# Patient Record
Sex: Male | Born: 1955
Health system: Southern US, Community
[De-identification: ages and names within clinical notes are randomized; demographics above are authoritative.]

## PROBLEM LIST (undated history)

## (undated) DIAGNOSIS — E7800 Pure hypercholesterolemia, unspecified: Secondary | ICD-10-CM

## (undated) DIAGNOSIS — R519 Headache, unspecified: Secondary | ICD-10-CM

## (undated) HISTORY — DX: Pure hypercholesterolemia, unspecified: E78.00

## (undated) HISTORY — PX: NO PAST SURGERIES: SHX2092

## (undated) HISTORY — DX: Headache, unspecified: R51.9

---

## 2013-07-27 ENCOUNTER — Ambulatory Visit: Payer: Self-pay | Admitting: Interventional Cardiology

## 2013-09-02 ENCOUNTER — Encounter: Payer: Self-pay | Admitting: Cardiology

## 2013-09-02 ENCOUNTER — Ambulatory Visit (INDEPENDENT_AMBULATORY_CARE_PROVIDER_SITE_OTHER): Payer: 59 | Admitting: Cardiology

## 2013-09-02 VITALS — BP 122/84 | HR 46 | Ht 69.0 in | Wt 202.0 lb

## 2013-09-02 DIAGNOSIS — Z8249 Family history of ischemic heart disease and other diseases of the circulatory system: Secondary | ICD-10-CM

## 2013-09-02 DIAGNOSIS — E7800 Pure hypercholesterolemia, unspecified: Secondary | ICD-10-CM

## 2013-09-02 MED ORDER — ATORVASTATIN CALCIUM 20 MG PO TABS: 20.0000 mg | ORAL_TABLET | Freq: Every day | ORAL | Status: DC

## 2013-09-02 MED ORDER — SIMVASTATIN 20 MG PO TABS: 20.0000 mg | ORAL_TABLET | Freq: Every day | ORAL | Status: DC

## 2013-09-02 NOTE — Progress Notes (Signed)
      [address] [place], [place]  [address] Phone: [phone] Fax:  [phone]  Date:  09/02/2013   ID:  Oscar LeaderJody Bridwell, DOB 02/21/1956, MRN [id number]  PCP:  No primary provider on file.   History of Present Illness: Oscar Woodard is a 58 y.o. male here for the evaluation of possible coronary artery disease with a family history of CAD. His father had myocardial infarction at 5756, brother had MI at 1956. LDL on simvastatin is 92. He denies any chest pain, shortness of breath.  Early in his 3420s, he battled with Raynaud's phenomenon quite frequently. This year during her intense winter, he did have an episode once again of blue fingertips, pain. This is unusual for him.  Prone to dehydration he states. Exercises 3-5 times a week on treadmill at home. Travels occasionally with his business.   Wt Readings from Last 3 Encounters:  09/02/13 202 lb (91.627 kg)     No past medical history on file. prior Raynaud's phenomenon  No past surgical history on file.  Current Outpatient Prescriptions  Medication Sig Dispense Refill  . aspirin 81 MG tablet Take 81 mg by mouth daily.       No current facility-administered medications for this visit.    Allergies:    Allergies  Allergen Reactions  . Penicillins     Upset stomach    Social History:  The patient  reports that he has never smoked. He does not have any smokeless tobacco history on file. 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  Family History  Problem Relation Age of Onset  . Heart attack Father 8556  . Heart attack Brother 2656   father and brother MI at 4256.  ROS:  Please see the history of present illness.   No syncope, no bleeding, no orthopnea, no PND, no rashes, no bleeding, no strokelike symptoms, no chest pain, shortness of breath.   All other systems reviewed and negative.   PHYSICAL EXAM: VS:  BP 122/84  Pulse 46  Ht 5\' 9"  (1.753 m)  Wt 202 lb (91.627 kg)  BMI 29.82 kg/m2 Well nourished, well developed, in no  acute distress HEENT: normal, [place]/AT, EOMI Neck: no JVD, normal carotid upstroke, no bruit Cardiac:  normal S1, S2; RRR; no murmur Lungs:  clear to auscultation bilaterally, no wheezing, rhonchi or rales Abd: soft, nontender, no hepatomegaly, no bruits Ext: no edema, 2+ distal pulses Skin: warm and dry GU: deferred Neuro: no focal abnormalities noted, AAO x 3  EKG:  09/02/13-heart rate 46, sinus bradycardia, normal intervals.    Labs: 07/04/13, glucose 103, creatinine 0.95, liver functions normal, LDL 92, triglycerides 96, HDL 52  ASSESSMENT AND PLAN:  1. Strong family history of CAD/myocardial infarction-remote aggressive primary prevention. I agree with aspirin. I agree with statin therapy. His last LDL was in the 90s. I will transition him back to atorvastatin 20 mg once a day. I will also check an exercise treadmill test in him. His brother was asymptomatic prior to his MI. His brother had "widow maker lesion ". A treadmill will be helpful to exclude any degree of significant ischemia. I will see him back in one year. Continue with exercise. 2. Hyperlipidemia-Dr. Docia ChuckKoirala we'll continue to follow. I will change him from simvastatin over to atorvastatin 20 mg. I would like for his LDL to be 70.  Signed, Donato SchultzMark Lasasha Brophy, MD [facility]  09/02/2013 12:22 PM

## 2013-09-02 NOTE — Patient Instructions (Signed)
Your physician has recommended you make the following change in your medication:   1. Stop Simvastatin  2. Start Atorvastatin 20mg  once daily  Your physician has requested that you have an exercise tolerance test. For further information please visit [email or web address] Please also follow instruction sheet, as given.  Your physician wants you to follow-up in: 1 year with Dr. Anne FuSkains. You will receive a reminder letter in the mail two months in advance. If you don't receive a letter, please call our office to schedule the follow-up appointment.

## 2013-10-04 ENCOUNTER — Encounter: Payer: 59 | Admitting: Nurse Practitioner

## 2013-11-11 ENCOUNTER — Ambulatory Visit (INDEPENDENT_AMBULATORY_CARE_PROVIDER_SITE_OTHER): Payer: 59 | Admitting: Nurse Practitioner

## 2013-11-11 VITALS — BP 115/77 | HR 73

## 2013-11-11 DIAGNOSIS — E785 Hyperlipidemia, unspecified: Secondary | ICD-10-CM

## 2013-11-11 DIAGNOSIS — E7800 Pure hypercholesterolemia, unspecified: Secondary | ICD-10-CM | POA: Insufficient documentation

## 2013-11-11 DIAGNOSIS — Z8249 Family history of ischemic heart disease and other diseases of the circulatory system: Secondary | ICD-10-CM

## 2013-11-11 NOTE — Progress Notes (Addendum)
Exercise Treadmill Test  Pre-Exercise Testing Evaluation Rhythm: sinus bradycardia  Rate: 58 bpm     Test  Exercise Tolerance Test Ordering MD: Donato SchultzMark Skains, MD  Interpreting MD: Norma FredricksonLori Gerhardt, NP  Unique Test No: 1  Treadmill:  1  Indication for ETT: Family history  Contraindication to ETT: No   Stress Modality: exercise - treadmill  Cardiac Imaging Performed: non   Protocol: standard Bruce - maximal  Max BP:  189/70  Max MPHR (bpm):  165 85% MPR (bpm):  139  MPHR obtained (bpm):  142 % MPHR obtained:  86%  Reached 85% MPHR (min:sec):  9:18 Total Exercise Time (min-sec):  10:00  Workload in METS:  11.7 Borg Scale: 17  Reason ETT Terminated:  desired heart rate attained    ST Segment Analysis At Rest: normal ST segments - no evidence of significant ST depression With Exercise: no evidence of significant ST depression  Other Information Arrhythmia:  No Angina during ETT:  absent (0) Quality of ETT:  diagnostic  ETT Interpretation:  normal - no evidence of ischemia by ST analysis  Comments: Patient presents today for routine GXT. Has HLD and strong family history of CAD. No active symptoms.   Today the patient exercised on the standard Bruce protocol for a total of 10 minutes.  Good exercise tolerance.  Adequate blood pressure response.  Clinically negative for chest pain. Test was stopped due to achievement of target HR - no angina.  EKG with nonspecific changes/T wave inversion inferiorly -  No diagnostic ST changes to meet criteria. No significant arrhythmia noted.    Recommendations:  Reviewed with Dr. Swazil[place]Jordan - no significant ST changes. Was able to go 10 minutes on the standard Bruce protocol. He feels no further testing is indicated but would like for Dr. Anne FuSkains to review when he is back on Monday.   CV risk factor modification  See back as directed unless Dr. Anne FuSkains would like further testing.  Patient is agreeable to this plan and will call if any problems develop  in the interim.   Rosalio MacadamiaLori C. Gerhardt, RN, ANP-C [facility] [address] [place], [place]  [address] [phone]   Addendum: 11/14/13  GXT reviewed with Dr. Anne FuSkains as well today. He agreed that no further testing is needed. Patient will be advised.

## 2013-11-14 ENCOUNTER — Telehealth: Payer: Self-pay | Admitting: Cardiology

## 2013-11-14 ENCOUNTER — Telehealth: Payer: Self-pay | Admitting: *Deleted

## 2013-11-14 NOTE — Telephone Encounter (Signed)
Follow Up ° °Pt returned call//  °

## 2013-11-14 NOTE — Telephone Encounter (Signed)
Left message on machine for pt to contact the office.   

## 2013-11-22 ENCOUNTER — Telehealth: Payer: Self-pay | Admitting: *Deleted

## 2013-11-22 ENCOUNTER — Encounter: Payer: Self-pay | Admitting: *Deleted

## 2013-11-22 NOTE — Telephone Encounter (Signed)
S/w pt's wife is aware of treadmill results and that no further testing is needed at this time

## 2013-11-22 NOTE — Telephone Encounter (Signed)
Message copied by Debbe BalesGONZALEZ, Kaitlinn Iversen R on Tue Nov 22, 2013  4:57 PM ------      Message from: Rosalio MacadamiaGERHARDT, LORI C      Created: Mon Nov 14, 2013  8:33 AM       Can you call him and let him know that Dr. Anne FuSkains reviewed his treadmill also and says that it looks ok and he does not feel any further testing is needed.            lori ------

## 2013-11-22 NOTE — Telephone Encounter (Signed)
New message  ° ° °Patient returning nurse call.   °

## 2014-09-10 ENCOUNTER — Other Ambulatory Visit: Payer: Self-pay | Admitting: Cardiology

## 2014-10-04 ENCOUNTER — Encounter: Payer: Self-pay | Admitting: Cardiology

## 2014-10-04 ENCOUNTER — Other Ambulatory Visit: Payer: Self-pay | Admitting: *Deleted

## 2014-10-04 ENCOUNTER — Ambulatory Visit (INDEPENDENT_AMBULATORY_CARE_PROVIDER_SITE_OTHER): Payer: 59 | Admitting: Cardiology

## 2014-10-04 VITALS — BP 112/72 | HR 57 | Ht 69.0 in | Wt 203.0 lb

## 2014-10-04 DIAGNOSIS — E785 Hyperlipidemia, unspecified: Secondary | ICD-10-CM

## 2014-10-04 DIAGNOSIS — Z8249 Family history of ischemic heart disease and other diseases of the circulatory system: Secondary | ICD-10-CM

## 2014-10-04 NOTE — Progress Notes (Signed)
      [address] [place], [place]  [address] Phone: [phone] Fax:  [phone]  Date:  10/04/2014   ID:  Oscar LeaderJody Eddie, DOB 02/12/1956, MRN [id number]  PCP:  Darrow BussingKOIRALA,DIBAS, MD   History of Present Illness: Oscar Woodard is a 59 y.o. male here for the evaluation of possible coronary artery disease with a family history of CAD. His father had myocardial infarction at 1056, brother had MI at 7356. LDL on simvastatin is 92, now on atorvastatin. He denies any chest pain, shortness of breath.  Early in his 2220s, he battled with Raynaud's phenomenon quite frequently. This year during her intense winter, he did have an episode once again of blue fingertips, pain. This is unusual for him.  Prone to dehydration he states. Exercises 3-5 times a week on treadmill at home. 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   Wt Readings from Last 3 Encounters:  10/04/14 203 lb (92.08 kg)  09/02/13 202 lb (91.627 kg)     No past medical history on file. prior Raynaud's phenomenon  No past surgical history on file.  Current Outpatient Prescriptions  Medication Sig Dispense Refill  . aspirin 81 MG tablet Take 81 mg by mouth daily.    Marland Kitchen. atorvastatin (LIPITOR) 20 MG tablet TAKE 1 TABLET DAILY 90 tablet 0   No current facility-administered medications for this visit.    Allergies:    Allergies  Allergen Reactions  . Penicillins     Upset stomach    Social History:  The patient  reports that he has never smoked. He does not have any smokeless tobacco history on file. 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  Family History  Problem Relation Age of Onset  . Heart attack Father 2856  . Heart attack Brother 3756   father and brother MI at 4856.   ROS:  Please see the history of present illness.   No syncope, no bleeding, no orthopnea, no PND, no rashes, no bleeding, no strokelike symptoms, no chest pain, shortness of breath.   All other systems reviewed and negative.   PHYSICAL EXAM: VS:  BP  112/72 mmHg  Pulse 57  Ht 5\' 9"  (1.753 m)  Wt 203 lb (92.08 kg)  BMI 29.96 kg/m2 Well nourished, well developed, in no acute distress HEENT: normal, [place]/AT, EOMI Neck: no JVD, normal carotid upstroke, no bruit Cardiac:  normal S1, S2; RRR; no murmur Lungs:  clear to auscultation bilaterally, no wheezing, rhonchi or rales Abd: soft, nontender, no hepatomegaly, no bruits Ext: no edema, 2+ distal pulses Skin: warm and dry GU: deferred Neuro: no focal abnormalities noted, AAO x 3  EKG:  09/02/13-heart rate 46, sinus bradycardia, normal intervals.    Labs: 07/04/13, glucose 103, creatinine 0.95, liver functions normal, LDL 92, triglycerides 96, HDL 52  ASSESSMENT AND PLAN:  1. Strong family history of CAD/myocardial infarction-remote aggressive primary prevention. I agree with aspirin. I agree with statin therapy. His last LDL was in the 90s. I will transition him back to atorvastatin 20 mg once a day. Exercise treadmill test in 2015 was reassuring. His brother was asymptomatic prior to his MI. His brother had "widow maker lesion ".I will see him back in one year. Continue with exercise. 2. Hyperlipidemia-Dr. Docia ChuckKoirala we'll continue to follow.atorvastatin 20 mg. I would like for his LDL to be 70.  Signed, Donato SchultzMark Winslow Ederer, MD [facility]  10/04/2014 9:54 AM

## 2014-10-04 NOTE — Patient Instructions (Signed)
Medication Instructions:  Your physician recommends that you continue on your current medications as directed. Please refer to the Current Medication list given to you today.  Follow-Up: Follow up in 1 year with Dr. Skains.  You will receive a letter in the mail 2 months before you are due.  Please call us when you receive this letter to schedule your follow up appointment.  Thank you for choosing [facility]!!       

## 2014-10-25 ENCOUNTER — Other Ambulatory Visit: Payer: Self-pay

## 2014-10-25 MED ORDER — ATORVASTATIN CALCIUM 20 MG PO TABS: 20.0000 mg | ORAL_TABLET | Freq: Every day | ORAL | Status: DC

## 2014-10-25 NOTE — Telephone Encounter (Signed)
Per note 5.18.16 

## 2014-11-29 ENCOUNTER — Other Ambulatory Visit: Payer: Self-pay | Admitting: Cardiology

## 2015-10-02 ENCOUNTER — Telehealth: Payer: Self-pay | Admitting: Cardiology

## 2015-10-02 NOTE — Telephone Encounter (Signed)
Called pt and left message for pt to call back to update pt's Fm & medical Hx.

## 2015-10-07 NOTE — Progress Notes (Signed)
[address] [place], [place]  [address] Phone: [phone] Fax:  [phone]  Date:  10/08/2015   ID:  Oscar LeaderJody Woodard, DOB 02/15/1956, MRN [id number]  PCP:  Darrow BussingKOIRALA,DIBAS, MD   History of Present Illness: Oscar LeaderJody Demonbreun is a 60 y.o. male here for the evaluation of possible coronary artery disease with a family history of CAD. His father had myocardial infarction at 6156, brother had MI at 3856. LDL on atorvastatin is 84. He denies any chest pain, shortness of breath. 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 He mentioned a recent trip to [place], enjoys crab cakes.  Early in his 1420s, he battled with Raynaud's phenomenon quite frequently. This year during her intense winter, he did have an episode once again of blue fingertips, pain. This is unusual for him.  Prone to dehydration he states. Exercises 3-5 times a week on treadmill at home. Travels occasionally with his business, [employer].  Overall quite pleased the way he is feeling. Weight is slightly increased. Working on this.   Wt Readings from Last 3 Encounters:  10/08/15 205 lb 12.8 oz (93.35 kg)  10/04/14 203 lb (92.08 kg)  09/02/13 202 lb (91.627 kg)     No past medical history on file. prior Raynaud's phenomenon  No past surgical history on file.  Current Outpatient Prescriptions  Medication Sig Dispense Refill  . aspirin 81 MG tablet Take 81 mg by mouth daily.    Marland Kitchen. atorvastatin (LIPITOR) 20 MG tablet Take 1 tablet (20 mg total) by mouth daily. 90 tablet 3   No current facility-administered medications for this visit.    Allergies:    Allergies  Allergen Reactions  . Penicillins     Upset stomach    Social History:  The patient  reports that he has never smoked. He does not have any smokeless tobacco history on file. 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  Family History  Problem Relation Age of Onset  . Heart attack Father 3856  . Heart attack Brother 4356   father and brother MI at 7256.    ROS:  Please see the history of present illness.   No syncope, no bleeding, no orthopnea, no PND, no rashes, no bleeding, no strokelike symptoms, no chest pain, shortness of breath.   All other systems reviewed and negative.   PHYSICAL EXAM: VS:  BP 126/78 mmHg  Pulse 53  Ht 5\' 9"  (1.753 m)  Wt 205 lb 12.8 oz (93.35 kg)  BMI 30.38 kg/m2 Well nourished, well developed, in no acute distress HEENT: normal, [place]/AT, EOMI Neck: no JVD, normal carotid upstroke, no bruit Cardiac:  normal S1, S2; bradycardic regular; no murmur Lungs:  clear to auscultation bilaterally, no wheezing, rhonchi or rales Abd: soft, nontender, no hepatomegaly, no bruits Ext: no edema, 2+ distal pulses Skin: warm and dry GU: deferred Neuro: no focal abnormalities noted, AAO x 3  EKG:  EKG was ordered today. 10/08/15-sinus bradycardia rate 53 with no other significant abnormalities personally viewed-prior 09/02/13-heart rate 46, sinus bradycardia, normal intervals.     Exercise treadmill test 2015-low risk, no ischemia  Labs: 07/04/13, glucose 103, creatinine 0.95, liver functions normal, LDL 92, triglycerides 96, HDL 52  ASSESSMENT AND PLAN:  1. Strong family history of CAD/myocardial infarction-remote aggressive primary prevention. I agree with aspirin. I agree with statin therapy. His last LDL was in the 90s. I will transition him back to atorvastatin 20 mg once a day. Exercise treadmill test in  2015 was reassuring. His brother was asymptomatic prior to his MI. His brother had "widow maker lesion ".I will see him back in one year. Continue with exercise. Continue to work on weight loss. BMI is 30.3. Obese range. I would like for him to lose at least 10 pounds. 2. Hyperlipidemia-Dr. Docia Chuck we'll continue to follow.atorvastatin 20 mg. LDL 84 in 2017.  Signed, Donato Schultz, MD [facility]  10/08/2015 8:48 AM

## 2015-10-08 ENCOUNTER — Encounter: Payer: Self-pay | Admitting: Cardiology

## 2015-10-08 ENCOUNTER — Ambulatory Visit (INDEPENDENT_AMBULATORY_CARE_PROVIDER_SITE_OTHER): Payer: 59 | Admitting: Cardiology

## 2015-10-08 VITALS — BP 126/78 | HR 53 | Ht 69.0 in | Wt 205.8 lb

## 2015-10-08 DIAGNOSIS — E785 Hyperlipidemia, unspecified: Secondary | ICD-10-CM | POA: Diagnosis not present

## 2015-10-08 DIAGNOSIS — Z8249 Family history of ischemic heart disease and other diseases of the circulatory system: Secondary | ICD-10-CM

## 2015-10-08 NOTE — Patient Instructions (Signed)

## 2016-02-18 ENCOUNTER — Other Ambulatory Visit: Payer: Self-pay | Admitting: Cardiology

## 2016-06-19 DIAGNOSIS — M2021 Hallux rigidus, right foot: Secondary | ICD-10-CM | POA: Diagnosis not present

## 2016-07-16 DIAGNOSIS — M79671 Pain in right foot: Secondary | ICD-10-CM | POA: Diagnosis not present

## 2016-07-16 DIAGNOSIS — M2021 Hallux rigidus, right foot: Secondary | ICD-10-CM | POA: Diagnosis not present

## 2016-07-28 DIAGNOSIS — M2021 Hallux rigidus, right foot: Secondary | ICD-10-CM | POA: Diagnosis not present

## 2016-08-04 DIAGNOSIS — M2021 Hallux rigidus, right foot: Secondary | ICD-10-CM | POA: Diagnosis not present

## 2016-08-08 DIAGNOSIS — Z1159 Encounter for screening for other viral diseases: Secondary | ICD-10-CM | POA: Diagnosis not present

## 2016-08-08 DIAGNOSIS — Z0000 Encounter for general adult medical examination without abnormal findings: Secondary | ICD-10-CM | POA: Diagnosis not present

## 2016-08-08 DIAGNOSIS — Z131 Encounter for screening for diabetes mellitus: Secondary | ICD-10-CM | POA: Diagnosis not present

## 2016-08-08 DIAGNOSIS — E7800 Pure hypercholesterolemia, unspecified: Secondary | ICD-10-CM | POA: Diagnosis not present

## 2016-08-08 DIAGNOSIS — Z125 Encounter for screening for malignant neoplasm of prostate: Secondary | ICD-10-CM | POA: Diagnosis not present

## 2016-10-16 ENCOUNTER — Ambulatory Visit (INDEPENDENT_AMBULATORY_CARE_PROVIDER_SITE_OTHER): Payer: 59 | Admitting: Cardiology

## 2016-10-16 ENCOUNTER — Encounter (INDEPENDENT_AMBULATORY_CARE_PROVIDER_SITE_OTHER): Payer: Self-pay

## 2016-10-16 ENCOUNTER — Encounter: Payer: Self-pay | Admitting: Cardiology

## 2016-10-16 VITALS — BP 112/78 | HR 54 | Ht 69.0 in | Wt 204.2 lb

## 2016-10-16 DIAGNOSIS — E669 Obesity, unspecified: Secondary | ICD-10-CM

## 2016-10-16 DIAGNOSIS — Z8249 Family history of ischemic heart disease and other diseases of the circulatory system: Secondary | ICD-10-CM

## 2016-10-16 DIAGNOSIS — E7800 Pure hypercholesterolemia, unspecified: Secondary | ICD-10-CM

## 2016-10-16 NOTE — Progress Notes (Signed)
[address] [place], [place]  [address] Phone: [phone] Fax:  [phone]  Date:  10/16/2016   ID:  Oscar Woodard, DOB 11/18/1955, MRN [id number]  PCP:  Darrow BussingKoirala, Dibas, MD   History of Present Illness: Oscar LeaderJody Woodard is a 61 y.o. male here for the evaluation of possible coronary artery disease with a family history of CAD. His father had myocardial infarction at 3556, brother had MI at 6956. LDL on atorvastatin is 84. He denies any chest pain, shortness of breath. 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 He mentioned a recent trip to [place], enjoys crab cakes.  Early in his 320s, he battled with Raynaud's phenomenon quite frequently. This year during her intense winter, he did have an episode once again of blue fingertips, pain. This is unusual for him.  Prone to dehydration he states. Exercises 3-5 times a week on treadmill at home. Travels occasionally with his business, [employer].  10/16/16-no changes over the past year. Doing well. Sprained his toe in November has not been able to lose any significant weight. Still traveling quite a bit. Denies chest pain, syncope, bleeding, orthopnea, PND. He did recently have a coworker that had a heart attack. He is now doing well.  Wt Readings from Last 3 Encounters:  10/16/16 204 lb 3.2 oz (92.6 kg)  10/08/15 205 lb 12.8 oz (93.4 kg)  10/04/14 203 lb (92.1 kg)     No past medical history on file. prior Raynaud's phenomenon  No past surgical history on file.  Current Outpatient Prescriptions  Medication Sig Dispense Refill  . aspirin 81 MG tablet Take 81 mg by mouth daily.    Marland Kitchen. atorvastatin (LIPITOR) 20 MG tablet TAKE 1 TABLET DAILY 90 tablet 2   No current facility-administered medications for this visit.     Allergies:    Allergies  Allergen Reactions  . Penicillins     Upset stomach    Social History:  The patient  reports that he has never smoked. He has never used smokeless tobacco. 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  Family History  Problem Relation Age of Onset  . Heart attack Father 156  . Heart attack Brother 3156   father and brother MI at 7156.   ROS:  Please see the history of present illness.   No syncope, no bleeding, no orthopnea, no PND, no rashes, no bleeding, no strokelike symptoms, no chest pain, shortness of breath.   All other systems reviewed and negative.   PHYSICAL EXAM: VS:  BP 112/78   Pulse (!) 54   Ht 5\' 9"  (1.753 m)   Wt 204 lb 3.2 oz (92.6 kg)   BMI 30.16 kg/m  GEN: Well nourished, well developed, in no acute distress  HEENT: normal  Neck: no JVD, carotid bruits, or masses Cardiac: RRR; no murmurs, rubs, or gallops,no edema  Respiratory:  clear to auscultation bilaterally, normal work of breathing GI: soft, nontender, nondistended, + BS MS: no deformity or atrophy  Skin: warm and dry, no rash Neuro:  Alert and Oriented x 3, Strength and sensation are intact Psych: euthymic mood, full affect  EKG:  EKG was ordered today. 10/16/16-sinus rhythm/sinus bradycardia rate 54 with no other significant abnormalities 10/08/15-sinus bradycardia rate 53 with no other significant abnormalities personally viewed-prior 09/02/13-heart rate 46, sinus bradycardia, normal intervals.     Exercise treadmill test 2015-low risk, no ischemia  Labs: 07/04/13, glucose 103, creatinine 0.95, liver functions normal, LDL 92,  triglycerides 96, HDL 52  ASSESSMENT AND PLAN:   1. Strong family history of CAD/myocardial infarction-remote aggressive primary prevention. I agree with aspirin. I agree with statin therapy. His last LDL was in the 90s. I will transition him back to atorvastatin 20 mg once a day. Exercise treadmill test in 2015 was reassuring. His brother was asymptomatic prior to his MI. His brother had "widow maker lesion ".I will see him back in one year. Continue with exercise. Continue to work on weight loss. BMI is 30.1. Obese range. I would like for him to lose at least 10  pounds. We discussed again today. No changes in plan. EKG reassuring. Prior treadmill test in 2015 reassuring. 2. Hyperlipidemia-Dr. Docia Chuck we'll continue to follow.atorvastatin 20 mg. LDL 64 previously. Described to him the benefits of plaque stabilization.  Signed, Donato Schultz, MD [facility]  10/16/2016 9:51 AM

## 2016-10-16 NOTE — Patient Instructions (Signed)

## 2016-10-17 NOTE — Addendum Note (Signed)
Addended by: Briscoe DeutscherWASHINGTON, Kellan Raffield R on: 10/17/2016 01:43 PM   Modules accepted: Orders

## 2017-02-02 ENCOUNTER — Other Ambulatory Visit: Payer: Self-pay | Admitting: Cardiology

## 2017-02-07 DIAGNOSIS — Z23 Encounter for immunization: Secondary | ICD-10-CM | POA: Diagnosis not present

## 2017-09-15 ENCOUNTER — Encounter: Payer: Self-pay | Admitting: Cardiology

## 2017-10-21 ENCOUNTER — Ambulatory Visit: Payer: 59 | Admitting: Cardiology

## 2017-10-21 ENCOUNTER — Other Ambulatory Visit: Payer: Self-pay | Admitting: Cardiology

## 2017-11-06 ENCOUNTER — Ambulatory Visit (INDEPENDENT_AMBULATORY_CARE_PROVIDER_SITE_OTHER): Payer: 59 | Admitting: Cardiology

## 2017-11-06 ENCOUNTER — Encounter (INDEPENDENT_AMBULATORY_CARE_PROVIDER_SITE_OTHER): Payer: Self-pay

## 2017-11-06 ENCOUNTER — Encounter: Payer: Self-pay | Admitting: Cardiology

## 2017-11-06 VITALS — BP 106/82 | HR 60 | Ht 69.0 in | Wt 206.8 lb

## 2017-11-06 DIAGNOSIS — Z8249 Family history of ischemic heart disease and other diseases of the circulatory system: Secondary | ICD-10-CM

## 2017-11-06 DIAGNOSIS — E7800 Pure hypercholesterolemia, unspecified: Secondary | ICD-10-CM | POA: Diagnosis not present

## 2017-11-06 MED ORDER — ATORVASTATIN CALCIUM 20 MG PO TABS: 20.0000 mg | ORAL_TABLET | Freq: Every day | ORAL | 3 refills | Status: DC

## 2017-11-06 NOTE — Patient Instructions (Signed)

## 2017-11-06 NOTE — Progress Notes (Signed)
[address] [place], [place]  [address] Phone: [phone](336) [phone] Fax:  [phone]([phone]  Date:  11/06/2017   ID:  Oscar LeaderJody Odonovan, DOB 05/11/1956, MRN [id number]  PCP:  Darrow BussingKoirala, Dib[place], MD   History of Present Illness: Oscar Woodard is a 62 y.o. male here for the evaluation of possible coronary artery disease with a family history of CAD. His father had myocardial infarction at 7456, brother had MI at 2856. LDL on atorvastatin is 84. He denies any chest pain, shortness of breath. 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 He mentioned a recent trip to [place], enjoys crab cakes.  Early in his 1320s, he battled with Raynaud's phenomenon quite frequently. This year during her intense winter, he did have an episode once again of blue fingertips, pain. This is unusual for him.  Prone to dehydration he states. Exercises 3-5 times a week on treadmill at home. Travels occasionally with his business, [employer].  10/16/16-no changes over the past year. Doing well. Sprained his toe in November has not been able to lose any significant weight. Still traveling quite a bit. Denies chest pain, syncope, bleeding, orthopnea, PND. He did recently have a coworker that had a heart attack. He is now doing well.  11/06/2017-overall doing quite well, no chest pain fevers chills nausea vomiting syncope bleeding.  Had discussion today about prevention.  See below.    Wt Readings from Last 3 Encounters:  11/06/17 206 lb 12.8 oz (93.8 kg)  10/16/16 204 lb 3.2 oz (92.6 kg)  10/08/15 205 lb 12.8 oz (93.4 kg)     History reviewed. No pertinent past medical history. prior Raynaud's phenomenon  History reviewed. No pertinent surgical history.  Current Outpatient Medications  Medication Sig Dispense Refill  . aspirin 81 MG tablet Take 81 mg by mouth daily.    Marland Kitchen. atorvastatin (LIPITOR) 20 MG tablet Take 1 tablet (20 mg total) by mouth daily. 90 tablet 3   No current facility-administered  medications for this visit.     Allergies:    Allergies  Allergen Reactions  . Penicillins     Upset stomach    Social History:  The patient  reports that he has never smoked. He has never used smokeless tobacco. 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  Family History  Problem Relation Age of Onset  . Heart attack Father 6956  . Heart attack Brother 4156   father and brother MI at 7156.   ROS:  Please see the history of present illness.   No syncope, no bleeding, no orthopnea, no PND, no rashes, no bleeding, no strokelike symptoms, no chest pain, shortness of breath.   All other systems reviewed and negative.   PHYSICAL EXAM: VS:  BP 106/82   Pulse 60   Ht 5\' 9"  (1.753 m)   Wt 206 lb 12.8 oz (93.8 kg)   SpO2 96%   BMI 30.54 kg/m  GEN: Well nourished, well developed, in no acute distress  HEENT: normal  Neck: no JVD, carotid bruits, or masses Cardiac: RRR; no murmurs, rubs, or gallops,no edema  Respiratory:  clear to auscultation bilaterally, normal work of breathing GI: soft, nontender, nondistended, + BS MS: no deformity or atrophy  Skin: warm and dry, no rash Neuro:  Alert and Oriented x 3, Strength and sensation are intact Psych: euthymic mood, full affect   EKG:  EKG was ordered today.  11/06/2017-sinus rhythm 60 no other abnormalities personally viewed-prior 10/16/16-sinus rhythm/sinus bradycardia rate 54  with no other significant abnormalities 10/08/15-sinus bradycardia rate 53 with no other significant abnormalities personally viewed-prior 09/02/13-heart rate 46, sinus bradycardia, normal intervals.     Exercise treadmill test 2015-low risk, no ischemia  Labs: 07/04/13, glucose 103, creatinine 0.95, liver functions normal, LDL 92, triglycerides 96, HDL 52  ASSESSMENT AND PLAN:   1. Strong family history of CAD/myocardial infarction-remote aggressive primary prevention. I agree with aspirin, prevention risk outweigh bleeding risk. I agree with statin therapy. His last LDL was in the  80s. Back to atorvastatin 20 mg once a day. Exercise treadmill test in 2015 was reassuring. His brother was asymptomatic prior to his MI. His brother had "widow maker lesion ". I will see him back in one year. Continue with exercise. Continue to work on weight loss.   He is having no symptoms, no chest pain, no shortness of breath.  Taking his medications without any side effects.  We discussed the potential goals for screening tests like CT scan today.  We will continue with aggressive medical management at this point. 2. Hyperlipidemia-Dr. Docia Chuck we'll continue to follow.atorvastatin 20 mg. LDL 64 previously. Described to him the benefits of plaque stabilization.  It is made.  Signed, Donato Schultz, MD [facility]  11/06/2017 8:54 AM

## 2017-12-11 DIAGNOSIS — E7800 Pure hypercholesterolemia, unspecified: Secondary | ICD-10-CM | POA: Diagnosis not present

## 2017-12-11 DIAGNOSIS — Z0000 Encounter for general adult medical examination without abnormal findings: Secondary | ICD-10-CM | POA: Diagnosis not present

## 2018-02-10 DIAGNOSIS — Z23 Encounter for immunization: Secondary | ICD-10-CM | POA: Diagnosis not present

## 2018-07-16 DIAGNOSIS — J069 Acute upper respiratory infection, unspecified: Secondary | ICD-10-CM | POA: Diagnosis not present

## 2018-07-16 DIAGNOSIS — H9202 Otalgia, left ear: Secondary | ICD-10-CM | POA: Diagnosis not present

## 2018-07-23 DIAGNOSIS — H9012 Conductive hearing loss, unilateral, left ear, with unrestricted hearing on the contralateral side: Secondary | ICD-10-CM | POA: Diagnosis not present

## 2018-07-23 DIAGNOSIS — H6122 Impacted cerumen, left ear: Secondary | ICD-10-CM | POA: Diagnosis not present

## 2018-07-23 DIAGNOSIS — H6502 Acute serous otitis media, left ear: Secondary | ICD-10-CM | POA: Diagnosis not present

## 2018-09-27 DIAGNOSIS — H93292 Other abnormal auditory perceptions, left ear: Secondary | ICD-10-CM | POA: Diagnosis not present

## 2018-09-27 DIAGNOSIS — H9202 Otalgia, left ear: Secondary | ICD-10-CM | POA: Diagnosis not present

## 2018-09-27 DIAGNOSIS — H9312 Tinnitus, left ear: Secondary | ICD-10-CM | POA: Diagnosis not present

## 2018-09-27 DIAGNOSIS — G510 Bell's palsy: Secondary | ICD-10-CM | POA: Diagnosis not present

## 2018-09-29 ENCOUNTER — Other Ambulatory Visit: Payer: Self-pay | Admitting: Otolaryngology

## 2018-09-29 DIAGNOSIS — G510 Bell's palsy: Secondary | ICD-10-CM

## 2018-10-04 ENCOUNTER — Other Ambulatory Visit: Payer: Self-pay

## 2018-10-04 ENCOUNTER — Ambulatory Visit
Admission: RE | Admit: 2018-10-04 | Discharge: 2018-10-04 | Disposition: A | Discharge status: Home / Self Care | Payer: 59 | Source: Ambulatory Visit | Attending: Otolaryngology | Admitting: Otolaryngology

## 2018-10-04 DIAGNOSIS — G510 Bell's palsy: Secondary | ICD-10-CM

## 2018-10-04 MED ORDER — GADOBENATE DIMEGLUMINE 529 MG/ML IV SOLN
19.0000 mL | Freq: Once | INTRAVENOUS | Status: AC | PRN
  Administered 2018-10-04: 10:00:00 19 mL via INTRAVENOUS

## 2018-10-09 ENCOUNTER — Other Ambulatory Visit: Payer: 59

## 2018-10-12 ENCOUNTER — Telehealth: Payer: Self-pay | Admitting: Neurology

## 2018-10-12 NOTE — Telephone Encounter (Signed)
Due to current COVID 19 pandemic, our office is severely reducing in office visits until further notice, in order to minimize the risk to our patients and healthcare providers.    Called patient and scheduled a virtual visit with Dr. Terrace Arabia for 5/28. Patient verbalized understanding of the [email or web address] process and I have sent him an e-mail (to [email or web address]) with link and instructions as well as my name and office number/hours for reference. Patient understands that he will receive a call from RN to update chart.   Pt understands that although there may be some limitations with this type of visit, we will take all precautions to reduce any security or privacy concerns.  Pt understands that this will be treated like an in office visit and we will file with pt's insurance, and there may be a patient responsible charge related to this service.an in office visit and we will file with pt's insurance, and there may be a patient responsible charge related to this service.

## 2018-10-13 ENCOUNTER — Encounter: Payer: Self-pay | Admitting: Neurology

## 2018-10-13 ENCOUNTER — Other Ambulatory Visit: Payer: 59

## 2018-10-13 NOTE — Telephone Encounter (Signed)
I contacted the pt and left a vm so we could complete the pre charting for 10/14/18 virtual visit.  Patient was not available so I left a vm.  Patient was advised of [facility] hours and #.  Chart updated from referral paper work received.

## 2018-10-13 NOTE — Addendum Note (Signed)
Addended by: Ann Maki T on: 10/13/2018 04:28 PM   Modules accepted: Orders

## 2018-10-14 ENCOUNTER — Encounter: Payer: Self-pay | Admitting: Neurology

## 2018-10-14 ENCOUNTER — Telehealth: Payer: Self-pay | Admitting: Neurology

## 2018-10-14 ENCOUNTER — Other Ambulatory Visit: Payer: Self-pay

## 2018-10-14 ENCOUNTER — Ambulatory Visit (INDEPENDENT_AMBULATORY_CARE_PROVIDER_SITE_OTHER): Payer: 59 | Admitting: Neurology

## 2018-10-14 ENCOUNTER — Other Ambulatory Visit: Payer: Self-pay | Admitting: *Deleted

## 2018-10-14 DIAGNOSIS — R519 Headache, unspecified: Secondary | ICD-10-CM | POA: Insufficient documentation

## 2018-10-14 DIAGNOSIS — H02402 Unspecified ptosis of left eyelid: Secondary | ICD-10-CM

## 2018-10-14 DIAGNOSIS — R51 Headache: Secondary | ICD-10-CM | POA: Diagnosis not present

## 2018-10-14 MED ORDER — SUMATRIPTAN SUCCINATE 50 MG PO TABS: ORAL_TABLET | ORAL | 6 refills | Status: DC

## 2018-10-14 MED ORDER — VERAPAMIL HCL 120 MG PO TABS: 120.0000 mg | ORAL_TABLET | Freq: Two times a day (BID) | ORAL | 6 refills | Status: DC

## 2018-10-14 MED ORDER — SUMATRIPTAN SUCCINATE 50 MG PO TABS: 50.0000 mg | ORAL_TABLET | ORAL | 6 refills | Status: DC | PRN

## 2018-10-14 NOTE — Telephone Encounter (Signed)
His prescriptions have been sent to the requested pharmacy.

## 2018-10-14 NOTE — Telephone Encounter (Signed)
Will do MRA head first.

## 2018-10-14 NOTE — Telephone Encounter (Signed)
Please give him a virtual follow up with me in 2 weeks.

## 2018-10-14 NOTE — Telephone Encounter (Signed)
Pt has called to inform  That his 2 meds SUMAtriptan (IMITREX) 50 MG tablet  verapamil (CALAN) 120 MG tablet need to be called into [facility] [phone]

## 2018-10-14 NOTE — Telephone Encounter (Signed)
Noted  

## 2018-10-14 NOTE — Telephone Encounter (Addendum)
I called the patient and he has been scheduled on 10/28/2018 at 1 pm.  Pt understands that although there may be some limitations with this type of visit, we will take all precautions to reduce any security or privacy concerns.  Pt understands that this will be treated like an in office visit and we will file with pt's insurance, and there may be a patient responsible charge related to this service.  Email w/ new appt information and [email or web address] link has been sent.

## 2018-10-14 NOTE — Progress Notes (Signed)
PATIENT: Oscar Woodard DOB: 09-26-1955  Virtual Visit via video  I connected with Oscar Woodard on 10/14/18 at  by video and verified that I am speaking with the correct person using two identifiers.   I discussed the limitations, risks, security and privacy concerns of performing an evaluation and management service by video and the availability of in person appointments. I also discussed with the patient that there may be a patient responsible charge related to this service. The patient expressed understanding and agreed to proceed.  HISTORICAL  Oscar Woodard is a 63 year old's male, seen in request by ENT Dr. Serena Colonel for evaluation of new onset headache, left side droopy eyelid, his primary care physician is Dr. Docia Chuck, [place]  I have reviewed and summarized the referring note from the referring physician.  He had past medical history of hyperlipidemia, otherwise was healthy  At the end of February 2020, he had cold symptoms, nose and ears were congested, aft he flew back from [place], he had worsening symptoms, was seen by ENT Dr. Pollyann Kennedy,  noticed fluid in his left ear he was given Z-Pak treatment, which has helped his sinus symptoms, but he began to have headaches, usually on the left frontal temporal region, shooting pain behind his left eye, left skull sensitivity, was seen by ENT few weeks later, had a second round of antibiotic treatment, also had Flonase, Sudafed, was told that his left ear infection has much improved  However he continues to have left-sided headaches, it happens almost on a daily basis, getting worse at evening time around 4-6 PM, he would get moderate left-sided headaches, left eye bloodshot, also had a persistent left side droopy eyelid,  he has frequent left side pulsatile tinnitus, occasionally headache will wake him up from sleep, he also reported worsening headache after eating hot dog, or drink alcohol  he denies visual loss on the left side, denies double  vision, trouble swallowing, or limb muscle weakness,  I personally reviewed MRI of the brain with without contrast on Oct 03, 2017, that was normal    Observations/Objective: I have reviewed problem lists, medications, allergies.  Awake alert oriented to history taking and casual conversation, no dysarthria, no aphasia, left eyelid, eye movement was normal, pupils are equal round and reactive to light, facial were symmetric, moving 4 extremities without difficulty  Assessment and Plan: New onset persistent left ptosis Left-sided headaches with autonomic features  MRI of the brain with and without contrast was normal  MRA, MRV of the brain to rule out vascular abnormality  Laboratory evaluations including acetylcholine receptor binding antibodies to rule out neuromuscular junctional disorder  His headache have some cluster headache features, with autonomic phenomenon,  Proceed with verapamil 80 mg twice a day as preventive medication, Imitrex 50 mg as needed    Follow Up Instructions:  In 2 weeks    I discussed the assessment and treatment plan with the patient. The patient was provided an opportunity to ask questions and all were answered. The patient agreed with the plan and demonstrated an understanding of the instructions.   The patient was advised to call back or seek an in-person evaluation if the symptoms worsen or if the condition fails to improve as anticipated.  I provided 40 minutes of non-face-to-face time during this encounter.  REVIEW OF SYSTEMS: Full 14 system review of systems performed and notable only for as above All other review of systems were negative.  ALLERGIES: Allergies  Allergen Reactions  . Cefaclor Nausea And  Vomiting  . Penicillins     Upset stomach  . Amoxicillin Rash    HOME MEDICATIONS: Current Outpatient Medications  Medication Sig Dispense Refill  . ASPIRIN-CALCIUM CARBONATE PO Take by mouth. 81 mg 300mg     . atorvastatin (LIPITOR) 20 MG  tablet Take 1 tablet (20 mg total) by mouth daily. 90 tablet 3   No current facility-administered medications for this visit.     PAST MEDICAL HISTORY: Past Medical History:  Diagnosis Date  . Headache   . High cholesterol     PAST SURGICAL HISTORY: Past Surgical History:  Procedure Laterality Date  . NO PAST SURGERIES      FAMILY HISTORY: Family History  Problem Relation Age of Onset  . Heart attack Father 3956  . Heart attack Brother 3856    SOCIAL HISTORY:   Social History   Socioeconomic History  . Marital status: Married    Spouse name: Not on file  . Number of children: Not on file  . Years of education: Not on file  . Highest education level: Not on file  Occupational History  . Not on file  Social Needs  . Financial resource strain: Not on file  . Food insecurity:    Worry: Not on file    Inability: Not on file  . Transportation needs:    Medical: Not on file    Non-medical: Not on file  Tobacco Use  . Smoking status: Never Smoker  . Smokeless tobacco: Never Used  Substance and Sexual Activity  . Alcohol use: Yes  . Drug use: Not on file  . Sexual activity: Not on file  Lifestyle  . Physical activity:    Days per week: Not on file    Minutes per session: Not on file  . Stress: Not on file  Relationships  . Social connections:    Talks on phone: Not on file    Gets together: Not on file    Attends religious service: Not on file    Active member of club or organization: Not on file    Attends meetings of clubs or organizations: Not on file    Relationship status: Not on file  . Intimate partner violence:    Fear of current or ex partner: Not on file    Emotionally abused: Not on file    Physically abused: Not on file    Forced sexual activity: Not on file  Other Topics Concern  . Not on file  Social History Narrative  . Not on file    Levert FeinsteinYijun Tyauna Lacaze, M.D. Ph.D.  [facility] [address], [address] [place], [place] [address]  Ph: [phone](336) [phone] Fax: [phone]([phone]  CC: Serena Colonelosen, Jefry, MD, Darrow BussingKoirala, Dibas, MD

## 2018-10-14 NOTE — Telephone Encounter (Signed)
The MRA Head and the MRV Head is the same CPT code both will not get approved.Marland Kitchen which one do you prefer the patient to have?

## 2018-10-18 NOTE — Telephone Encounter (Signed)
Because she recently has a MRI Brain. [employer] consider it a similar test. The case is pending I faxed clinical notes.  Case number [id number] for the MRA Head.

## 2018-10-19 NOTE — Telephone Encounter (Signed)
I called to check the status it is still pending they did receive my fax of clinical notes.

## 2018-10-20 ENCOUNTER — Ambulatory Visit: Payer: 59

## 2018-10-20 ENCOUNTER — Other Ambulatory Visit: Payer: Self-pay

## 2018-10-20 DIAGNOSIS — R51 Headache: Secondary | ICD-10-CM

## 2018-10-20 DIAGNOSIS — R519 Headache, unspecified: Secondary | ICD-10-CM

## 2018-10-20 NOTE — Telephone Encounter (Signed)
LVM for pt to call back about scheduling MRA  [facility] Auth: [id number] (exp. 10/19/18 to 12/03/18)

## 2018-10-20 NOTE — Telephone Encounter (Signed)
Patient returned my call he is scheduled for 10/20/18 at [facility].

## 2018-10-21 ENCOUNTER — Telehealth: Payer: Self-pay | Admitting: *Deleted

## 2018-10-21 LAB — COMPREHENSIVE METABOLIC PANEL
ALT: 31 IU/L (ref 0–44)
AST: 20 IU/L (ref 0–40)
Albumin/Globulin Ratio: 2.4 — ABNORMAL HIGH (ref 1.2–2.2)
Albumin: 4.6 g/dL (ref 3.8–4.9)
Alkaline Phosphatase: 62 IU/L (ref 39–117)
BUN/Creatinine Ratio: 20 (ref 10–24)
BUN: 19 mg/dL (ref 8–27)
Bilirubin Total: 0.4 mg/dL (ref 0.0–1.2)
CO2: 25 mmol/L (ref 20–29)
Calcium: 9.6 mg/dL (ref 8.6–10.2)
Chloride: 104 mmol/L (ref 96–106)
Creatinine, Ser: 0.94 mg/dL (ref 0.76–1.27)
GFR calc Af Amer: 101 mL/min/{1.73_m2} (ref >59)
GFR calc non Af Amer: 88 mL/min/{1.73_m2} (ref >59)
Globulin, Total: 1.9 g/dL (ref 1.5–4.5)
Glucose: 78 mg/dL (ref 65–99)
Potassium: 4.6 mmol/L (ref 3.5–5.2)
Sodium: 144 mmol/L (ref 134–144)
Total Protein: 6.5 g/dL (ref 6.0–8.5)

## 2018-10-21 LAB — CBC WITH DIFFERENTIAL/PLATELET
Basophils Absolute: 0 10*3/uL (ref 0.0–0.2)
Basos: 1 %
EOS (ABSOLUTE): 0.2 10*3/uL (ref 0.0–0.4)
Eos: 3 %
Hematocrit: 43.4 % (ref 37.5–51.0)
Hemoglobin: 15.1 g/dL (ref 13.0–17.7)
Immature Grans (Abs): 0 10*3/uL (ref 0.0–0.1)
Immature Granulocytes: 1 %
Lymphocytes Absolute: 1.6 10*3/uL (ref 0.7–3.1)
Lymphs: 27 %
MCH: 29.5 pg (ref 26.6–33.0)
MCHC: 34.8 g/dL (ref 31.5–35.7)
MCV: 85 fL (ref 79–97)
Monocytes Absolute: 0.5 10*3/uL (ref 0.1–0.9)
Monocytes: 9 %
Neutrophils Absolute: 3.7 10*3/uL (ref 1.4–7.0)
Neutrophils: 59 %
Platelets: 241 10*3/uL (ref 150–450)
RBC: 5.12 x10E6/uL (ref 4.14–5.80)
RDW: 13.1 % (ref 11.6–15.4)
WBC: 6.1 10*3/uL (ref 3.4–10.8)

## 2018-10-21 LAB — MYASTHENIA GRAVIS FULL PANEL
AChR Binding Ab, Serum: <0.03 nmol/L (ref 0.00–0.24)
Acetylchol Block Ab: 13 % (ref 0–25)
Acetylcholine Modulat Ab: <12 % (ref 0–20)
Anti-striation Abs: NEGATIVE

## 2018-10-21 LAB — VITAMIN B12: Vitamin B-12: 315 pg/mL (ref 232–1245)

## 2018-10-21 LAB — SYPHILIS: RPR W/REFLEX TO RPR TITER AND TREPONEMAL ANTIBODIES, TRADITIONAL SCREENING AND DIAGNOSIS ALGORITHM: RPR Ser Ql: NONREACTIVE

## 2018-10-21 LAB — ANA W/REFLEX IF POSITIVE: Anti Nuclear Antibody (ANA): NEGATIVE

## 2018-10-21 LAB — FOLATE: Folate: 7.4 ng/mL

## 2018-10-21 LAB — SEDIMENTATION RATE: Sed Rate: 2 mm/h (ref 0–30)

## 2018-10-21 LAB — TSH: TSH: 3.26 u[IU]/mL (ref 0.450–4.500)

## 2018-10-21 LAB — C-REACTIVE PROTEIN: CRP: <1 mg/L (ref 0–10)

## 2018-10-21 NOTE — Telephone Encounter (Signed)
-----   Message from Levert Feinstein, MD sent at 10/21/2018 11:54 AM EDT ----- Please call patient for no significant abnormalities on laboratory evaluations

## 2018-10-21 NOTE — Telephone Encounter (Signed)
Spoke to the patient and notified him of his lab results. 

## 2018-10-28 ENCOUNTER — Other Ambulatory Visit: Payer: Self-pay

## 2018-10-28 ENCOUNTER — Ambulatory Visit (INDEPENDENT_AMBULATORY_CARE_PROVIDER_SITE_OTHER): Payer: 59 | Admitting: Neurology

## 2018-10-28 ENCOUNTER — Telehealth: Payer: Self-pay | Admitting: Neurology

## 2018-10-28 ENCOUNTER — Encounter: Payer: Self-pay | Admitting: Neurology

## 2018-10-28 DIAGNOSIS — H02402 Unspecified ptosis of left eyelid: Secondary | ICD-10-CM

## 2018-10-28 DIAGNOSIS — E7800 Pure hypercholesterolemia, unspecified: Secondary | ICD-10-CM

## 2018-10-28 DIAGNOSIS — R519 Headache, unspecified: Secondary | ICD-10-CM

## 2018-10-28 DIAGNOSIS — R51 Headache: Secondary | ICD-10-CM | POA: Diagnosis not present

## 2018-10-28 NOTE — Progress Notes (Signed)
PATIENT: Oscar Woodard DOB: [birth year]  Virtual Visit via video  I connected with Camelia Phenes on 10/28/18 at  by video and verified that I am speaking with the correct person using two identifiers.   I discussed the limitations, risks, security and privacy concerns of performing an evaluation and management service by video and the availability of in person appointments. I also discussed with the patient that there may be a patient responsible charge related to this service. The patient expressed understanding and agreed to proceed.  HISTORICAL  Sanjit Mcmichael is a 63 year old's male, seen in request by ENT Dr. Izora Gala for evaluation of new onset headache, left side droopy eyelid, his primary care physician is Dr. Dorthy Cooler, [place]  I have reviewed and summarized the referring note from the referring physician.  He had past medical history of hyperlipidemia, otherwise was healthy  At the end of February 2020, he had cold symptoms, nose and ears were congested, aft he flew back from [place], he had worsening symptoms, was seen by ENT Dr. Constance Holster,  noticed fluid in his left ear he was given Z-Pak treatment, which has helped his sinus symptoms, but he began to have headaches, usually on the left frontal temporal region, shooting pain behind his left eye, left skull sensitivity, was seen by ENT few weeks later, had a second round of antibiotic treatment, also had Flonase, Sudafed, was told that his left ear infection has much improved  However he continues to have left-sided headaches, it happens almost on a daily basis, getting worse at evening time around 4-6 PM, he would get moderate left-sided headaches, left eye bloodshot, also had a persistent left side droopy eyelid,  he has frequent left side pulsatile tinnitus, occasionally headache will wake him up from sleep, he also reported worsening headache after eating hot dog, or drink alcohol  he denies visual loss on the left side, denies double  vision, trouble swallowing, or limb muscle weakness,  I personally reviewed MRI of the brain with without contrast on Oct 03, 2017, that was normal  UPDATE October 28 2018: He is now taking verapamil 120mg  bid, 3-4 days after verapamil, he began to see significant improvement, he now still have pressure behind his left eye, left scalp mild sensitivity, occasionally pulsatile tinnitus, but overall has much improved, he did not try Imitrex, he only has mild headaches, he was able to drink wines without inducing headaches  Reviewed laboratory evaluation showed normal CBC, CMP, TSH, inflammatory markers, and as a code: Receptor binding antibodies   Observations/Objective: I have reviewed problem lists, medications, allergies.  Awake alert oriented to history taking and casual conversation, no dysarthria, no aphasia, left eyelid, eye movement was normal, pupils are equal round and reactive to light, he has mild left ptosis   Assessment and Plan: New onset persistent left ptosis Left-sided headaches with autonomic features  MRI of the brain with and without contrast was normal  MRA of brain was normal  Laboratory evaluations including acetylcholine receptor binding antibodies were normal his headache have some cluster headache features, with autonomic phenomenon,  His headache has much improved verapamil 80 mg twice a day as preventive medication, he did not have to try Imitrex as needed yet  MRI of neck with without contrast to rule out left internal carotid artery dissection, left Horner syndrome  Follow Up Instructions:  In 3 months    I discussed the assessment and treatment plan with the patient. The patient was provided an opportunity to ask  questions and all were answered. The patient agreed with the plan and demonstrated an understanding of the instructions.   The patient was advised to call back or seek an in-person evaluation if the symptoms worsen or if the condition fails to improve  as anticipated.  I provided 40 minutes of non-face-to-face time during this encounter.  REVIEW OF SYSTEMS: Full 14 system review of systems performed and notable only for as above All other review of systems were negative.  ALLERGIES: Allergies  Allergen Reactions  . Cefaclor Nausea And Vomiting  . Penicillins     Upset stomach  . Amoxicillin Rash    HOME MEDICATIONS: Current Outpatient Medications  Medication Sig Dispense Refill  . ASPIRIN-CALCIUM CARBONATE PO Take by mouth. 81 mg 300mg     . atorvastatin (LIPITOR) 20 MG tablet Take 1 tablet (20 mg total) by mouth daily. 90 tablet 3  . SUMAtriptan (IMITREX) 50 MG tablet Take 1 tab at onset of migraine.  May repeat in 2 hrs, if needed.  Max dose: 2 tabs/day. This is a 30 day prescription. 12 tablet 6  . verapamil (CALAN) 120 MG tablet Take 1 tablet (120 mg total) by mouth 2 (two) times daily. 60 tablet 6   No current facility-administered medications for this visit.     PAST MEDICAL HISTORY: Past Medical History:  Diagnosis Date  . Headache   . High cholesterol     PAST SURGICAL HISTORY: Past Surgical History:  Procedure Laterality Date  . NO PAST SURGERIES      FAMILY HISTORY: Family History  Problem Relation Age of Onset  . Heart attack Father 1656  . Heart attack Brother 856    SOCIAL HISTORY:   Social History   Socioeconomic History  . Marital status: Married    Spouse name: Not on file  . Number of children: Not on file  . Years of education: Not on file  . Highest education level: Not on file  Occupational History  . Not on file  Social Needs  . Financial resource strain: Not on file  . Food insecurity    Worry: Not on file    Inability: Not on file  . Transportation needs    Medical: Not on file    Non-medical: Not on file  Tobacco Use  . Smoking status: Never Smoker  . Smokeless tobacco: Never Used  Substance and Sexual Activity  . Alcohol use: Yes  . Drug use: Not on file  . Sexual  activity: Not on file  Lifestyle  . Physical activity    Days per week: Not on file    Minutes per session: Not on file  . Stress: Not on file  Relationships  . Social Musicianconnections    Talks on phone: Not on file    Gets together: Not on file    Attends religious service: Not on file    Active member of club or organization: Not on file    Attends meetings of clubs or organizations: Not on file    Relationship status: Not on file  . Intimate partner violence    Fear of current or ex partner: Not on file    Emotionally abused: Not on file    Physically abused: Not on file    Forced sexual activity: Not on file  Other Topics Concern  . Not on file  Social History Narrative  . Not on file    Levert FeinsteinYijun Leonardo Makris, M.D. Ph.D.  [facility] [address]  [place], [place] [address] Ph: [phone](336) [phone] Fax: [phone]  CC: Serena Colonelosen, Jefry, MD, Darrow BussingKoirala, [place]ib[place], MD

## 2018-10-28 NOTE — Telephone Encounter (Signed)
I have ordered MRA of neck w/wo on him, please put him on schedule before July 1,

## 2018-10-28 NOTE — Telephone Encounter (Signed)
Noted  

## 2018-11-01 NOTE — Telephone Encounter (Signed)
MRA Neck w/wo contrast pending faxed clinical notes to [facility].

## 2018-11-02 NOTE — Telephone Encounter (Signed)
no to the covid-19 questions MRA Neck w/wo contrast Dr. Krista [facility] Auth: [id number] (exp. 11/01/18 to 12/16/18). Patient is scheduled at [facility] for 11/03/18.

## 2018-11-02 NOTE — Telephone Encounter (Signed)
He has a follow up schedule on 11/08/2018.

## 2018-11-03 ENCOUNTER — Other Ambulatory Visit: Payer: Self-pay

## 2018-11-03 ENCOUNTER — Ambulatory Visit (INDEPENDENT_AMBULATORY_CARE_PROVIDER_SITE_OTHER): Payer: 59

## 2018-11-03 DIAGNOSIS — E7800 Pure hypercholesterolemia, unspecified: Secondary | ICD-10-CM | POA: Diagnosis not present

## 2018-11-03 DIAGNOSIS — H02402 Unspecified ptosis of left eyelid: Secondary | ICD-10-CM

## 2018-11-03 DIAGNOSIS — R51 Headache: Secondary | ICD-10-CM

## 2018-11-03 DIAGNOSIS — R519 Headache, unspecified: Secondary | ICD-10-CM

## 2018-11-03 MED ORDER — GADOBENATE DIMEGLUMINE 529 MG/ML IV SOLN
20.0000 mL | Freq: Once | INTRAVENOUS | Status: AC | PRN
  Administered 2018-11-03: 20 mL via INTRAVENOUS

## 2018-11-08 ENCOUNTER — Encounter: Payer: Self-pay | Admitting: Neurology

## 2018-11-08 ENCOUNTER — Other Ambulatory Visit: Payer: Self-pay

## 2018-11-08 ENCOUNTER — Ambulatory Visit (INDEPENDENT_AMBULATORY_CARE_PROVIDER_SITE_OTHER): Payer: 59 | Admitting: Neurology

## 2018-11-08 VITALS — BP 122/64 | HR 64 | Temp 96.4°F | Ht 69.0 in | Wt 212.5 lb

## 2018-11-08 DIAGNOSIS — R519 Headache, unspecified: Secondary | ICD-10-CM

## 2018-11-08 DIAGNOSIS — R51 Headache: Secondary | ICD-10-CM | POA: Diagnosis not present

## 2018-11-08 DIAGNOSIS — H02402 Unspecified ptosis of left eyelid: Secondary | ICD-10-CM | POA: Diagnosis not present

## 2018-11-08 MED ORDER — VERAPAMIL HCL 120 MG PO TABS: 120.0000 mg | ORAL_TABLET | Freq: Two times a day (BID) | ORAL | 11 refills | Status: DC

## 2018-11-08 NOTE — Progress Notes (Signed)
PATIENT: Oscar Woodard DOB: 03/13/1956  Chief Complaint  Patient presents with  . Headache    He is here to review his test results today.  Feels his headaches have improved since starting verapamil.  He has not needed the sumatriptan.     HISTORICAL    PATIENT: Oscar Woodard DOB: 12/18/1955    HISTORICAL  Oscar Woodard is a 63 year old's male, seen in request by ENT Dr. Serena Colonelosen, J[place] for evaluation of new onset headache, left side droopy eyelid, his primary care physician is Dr. Docia ChuckKoirala, [place]ib[place]  I have reviewed and summarized the referring note from the referring physician.  He had past medical history of hyperlipidemia, otherwise was healthy  At the end of February 2020, he had cold symptoms, nose and ears were congested, [place], he had worsening symptoms, was seen by ENT Dr. Pollyann Kennedyosen,  noticed fluid in his left ear he was given Z-Pak treatment, which has helped his sinus symptoms, but he began to have headaches, usually on the left frontal temporal region, shooting pain behind his left eye, left skull sensitivity, was seen by ENT few weeks later, had a second round of antibiotic treatment, also had Flonase, Sudafed, was told that his left ear infection has much improved  However he continues to have left-sided headaches, it happens almost on a daily basis, getting worse at evening time around 4-6 PM, he would get moderate left-sided headaches, left eye bloodshot, also had a persistent left side droopy eyelid,  he has frequent left side pulsatile tinnitus, occasionally headache will wake him up from sleep, he also reported worsening headache after eating hot dog, or drink alcohol  he denies visual loss on the left side, denies double vision, trouble swallowing, or limb muscle weakness,  I personally reviewed MRI of the brain with without contrast on Oct 03, 2017, that was normal  UPDATE October 28 2018: He is now taking verapamil 120mg  bid, 3-4 days after verapamil, he  began to see significant improvement, he now still have pressure behind his left eye, left scalp mild sensitivity, occasionally pulsatile tinnitus, but overall has much improved, he did not try Imitrex, he only has mild headaches, he was able to drink wines without inducing headaches  Reviewed laboratory evaluation showed normal CBC, CMP, TSH, inflammatory markers, and as a code: Receptor binding antibodies  UPDATE November 08 2018: He continues to have left ptosis, headache has much improved with verapmil, does not have to try Imitrex. I have personally reviewed MRI of the brain with without contrast on Oct 04, 2018, that was normal.  MRA of head and neck were normal  REVIEW OF SYSTEMS: Full 14 system review of systems performed and notable only for as above All other review of systems were negative.  ALLERGIES: Allergies  Allergen Reactions  . Cefaclor Nausea And Vomiting  . Penicillins     Upset stomach  . Amoxicillin Rash    HOME MEDICATIONS: Current Outpatient Medications  Medication Sig Dispense Refill  . ASPIRIN-CALCIUM CARBONATE PO Take by mouth. 81 mg 300mg     . atorvastatin (LIPITOR) 20 MG tablet Take 1 tablet (20 mg total) by mouth daily. 90 tablet 3  . SUMAtriptan (IMITREX) 50 MG tablet Take 1 tab at onset of migraine.  May repeat in 2 hrs, if needed.  Max dose: 2 tabs/day. This is a 30 day prescription. 12 tablet 6  . verapamil (CALAN) 120 MG tablet Take 1 tablet (120 mg total) by mouth 2 (two)  times daily. 60 tablet 11   No current facility-administered medications for this visit.     PAST MEDICAL HISTORY: Past Medical History:  Diagnosis Date  . Headache   . High cholesterol     PAST SURGICAL HISTORY: Past Surgical History:  Procedure Laterality Date  . NO PAST SURGERIES      FAMILY HISTORY: Family History  Problem Relation Age of Onset  . Heart attack Father 3256  . Heart attack Brother 1956    SOCIAL HISTORY: Social History   Socioeconomic History  .  Marital status: Married    Spouse name: Not on file  . Number of children: Not on file  . Years of education: Not on file  . Highest education level: Not on file  Occupational History  . Not on file  Social Needs  . Financial resource strain: Not on file  . Food insecurity    Worry: Not on file    Inability: Not on file  . Transportation needs    Medical: Not on file    Non-medical: Not on file  Tobacco Use  . Smoking status: Never Smoker  . Smokeless tobacco: Never Used  Substance and Sexual Activity  . Alcohol use: Yes  . Drug use: Not on file  . Sexual activity: Not on file  Lifestyle  . Physical activity    Days per week: Not on file    Minutes per session: Not on file  . Stress: Not on file  Relationships  . Social Musicianconnections    Talks on phone: Not on file    Gets together: Not on file    Attends religious service: Not on file    Active member of club or organization: Not on file    Attends meetings of clubs or organizations: Not on file    Relationship status: Not on file  . Intimate partner violence    Fear of current or ex partner: Not on file    Emotionally abused: Not on file    Physically abused: Not on file    Forced sexual activity: Not on file  Other Topics Concern  . Not on file  Social History Narrative  . Not on file     PHYSICAL EXAM   Vitals:   11/08/18 0850  BP: 122/64  Pulse: 64  Temp: (!) 96.4 F (35.8 C)  Weight: 212 lb 8 oz (96.4 kg)  Height: 5\' 9"  (1.753 m)    Not recorded      Body mass index is 31.38 kg/m.  PHYSICAL EXAMNIATION:  Gen: NAD, conversant, well nourised, obese, well groomed                     Cardiovascular: Regular rate rhythm, no peripheral edema, warm, nontender. Eyes: Conjunctivae clear without exudates or hemorrhage Neck: Supple, no carotid bruits. Pulmonary: Clear to auscultation bilaterally   NEUROLOGICAL EXAM:  MENTAL STATUS: Speech:    Speech is normal; fluent and spontaneous with normal  comprehension.  Cognition:     Orientation to time, place and person     Normal recent and remote memory     Normal Attention span and concentration     Normal Language, naming, repeating,spontaneous speech     Fund of knowledge   CRANIAL NERVES: CN II: Visual fields are full to confrontation.  Pupils are round equal and briskly reactive to light. CN III, IV, VI: extraocular movement are normal.  He has mild static left ptosis CN V: Facial sensation  is intact to pinprick in all 3 divisions bilaterally. Corneal responses are intact.  CN VII: Face is symmetric with normal eye closure and smile. CN VIII: Hearing is normal to rubbing fingers CN IX, X: Palate elevates symmetrically. Phonation is normal. CN XI: Head turning and shoulder shrug are intact CN XII: Tongue is midline with normal movements and no atrophy.  MOTOR: There is no pronator drift of out-stretched arms. Muscle bulk and tone are normal. Muscle strength is normal.  REFLEXES: Reflexes are 2+ and symmetric at the biceps, triceps, knees, and ankles. Plantar responses are flexor.  SENSORY: Intact to light touch, pinprick, positional sensation and vibratory sensation are intact in fingers and toes.  COORDINATION: Rapid alternating movements and fine finger movements are intact. There is no dysmetria on finger-to-nose and heel-knee-shin.    GAIT/STANCE: Posture is normal. Gait is steady with normal steps, base, arm swing, and turning. Heel and toe walking are normal. Tandem gait is normal.  Romberg is absent.   DIAGNOSTIC DATA (LABS, IMAGING, TESTING) - I reviewed patient records, labs, notes, testing and imaging myself where available.   ASSESSMENT AND PLAN  Dvid Pendry is a 63 y.o. male   New onset persistent left ptosis Left-sided headaches with autonomic features  MRI of the brain with and without contrast was normal  MRA of brain and neck were normal  Laboratory evaluations including acetylcholine receptor  binding antibodies were normal   his headache have some cluster headache features, with autonomic phenomenon,  His headache has much improved verapamil 120mg  twice a day as preventive medication, he did not have to try Imitrex as needed yet  Will continue verapamil for couple more months, if he no longer has headaches, will taper off verapamil,  Return to clinic as needed     Marcial Pacas, M.D. Ph.D.  [facility] [address] [place], [place] [address] Ph: [phone] Fax: [phone]  CC: Referring Provider

## 2018-11-11 ENCOUNTER — Telehealth: Payer: Self-pay | Admitting: Neurology

## 2018-11-11 ENCOUNTER — Other Ambulatory Visit: Payer: Self-pay | Admitting: *Deleted

## 2018-11-11 MED ORDER — VERAPAMIL HCL 120 MG PO TABS: 120.0000 mg | ORAL_TABLET | Freq: Two times a day (BID) | ORAL | 3 refills | Status: DC

## 2018-11-11 NOTE — Telephone Encounter (Signed)
I returned the call to [facility] to notify them this rx has been sent electronically.  They will get the order ready for the patient.

## 2018-11-11 NOTE — Telephone Encounter (Signed)
[facility] is calling in requesting a verbal order for verapamil (CALAN) 120 MG tablet to be called in  CB# [phone]  Ref# [id number]

## 2018-11-15 ENCOUNTER — Telehealth: Payer: Self-pay | Admitting: *Deleted

## 2018-11-15 NOTE — Telephone Encounter (Signed)
7/16 should be ok in person.   Thanks    [name], MD      You are DOD this day (7/16) and can see the patient in office. You are not physically in the office again until this date. If you feel he needs to be seen prior to this date I can try to schedule with an APP if pt is agreeable.     Thanks    Pam    ----- Message -----  From: Camelia Phenes  Sent: 11/15/2018 11:28 AM EDT  To: [address] Triage  Subject: Non-Urgent Medical Question             Doc    We have a virtual meeting coming up on 7/16. I am having some extra stress these days as my position was eliminated at work after 25 years on June 5th.     With that said, I'm sure it is just stress, but I am having an occasional heaviness when I "think too much" :-)     It goes away and I don't have any pain or other symptoms, but thought an in person office visit might be more appropriate.     Thoughts?    Thanks  [employer] and spoke with patient regarding the received message above.  Pt is agreeable to being seen in the office 7/16 at 8 am.  Aware he will be called back closer to time to review screening questions.  He will call back before then if any further questions of concerns.

## 2018-11-30 ENCOUNTER — Telehealth: Payer: Self-pay

## 2018-11-30 NOTE — Telephone Encounter (Signed)

## 2018-12-02 ENCOUNTER — Encounter: Payer: Self-pay | Admitting: Cardiology

## 2018-12-02 ENCOUNTER — Other Ambulatory Visit: Payer: Self-pay

## 2018-12-02 ENCOUNTER — Ambulatory Visit (INDEPENDENT_AMBULATORY_CARE_PROVIDER_SITE_OTHER): Payer: 59 | Admitting: Cardiology

## 2018-12-02 VITALS — BP 118/80 | HR 53 | Ht 69.0 in | Wt 210.8 lb

## 2018-12-02 DIAGNOSIS — E7800 Pure hypercholesterolemia, unspecified: Secondary | ICD-10-CM | POA: Diagnosis not present

## 2018-12-02 DIAGNOSIS — R079 Chest pain, unspecified: Secondary | ICD-10-CM

## 2018-12-02 DIAGNOSIS — Z8249 Family history of ischemic heart disease and other diseases of the circulatory system: Secondary | ICD-10-CM | POA: Diagnosis not present

## 2018-12-02 DIAGNOSIS — R0789 Other chest pain: Secondary | ICD-10-CM | POA: Diagnosis not present

## 2018-12-02 DIAGNOSIS — Z01812 Encounter for preprocedural laboratory examination: Secondary | ICD-10-CM

## 2018-12-02 NOTE — Patient Instructions (Addendum)
Medication Instructions:  The current medical regimen is effective;  continue present plan and medications.  If you need a refill on your cardiac medications before your next appointment, please call your pharmacy.   Lab work: You will lab work prior to your Coronary CT (BMP) If you have labs (blood work) drawn today and your tests are completely normal, you will receive your results only by: Marland Kitchen MyChart Message (if you have MyChart) OR . A paper copy in the mail If you have any lab test that is abnormal or we need to change your treatment, we will call you to review the results.  Testing/Procedures: Your physician has requested that you have Coronary CT. Cardiac computed tomography (CT) is a painless test that uses an x-ray machine to take clear, detailed pictures of your heart. For further information please visit HugeFiesta.tn. Please follow instruction sheet as given.  You will be called to be scheduled for this testing.  Follow-Up: At [facility], you and your health needs are our priority.  As part of our continuing mission to provide you with exceptional heart care, we have created designated Provider Care Teams.  These Care Teams include your primary Cardiologist (physician) and Advanced Practice Providers (APPs -  Physician Assistants and Nurse Practitioners) who all work together to provide you with the care you need, when you need it. You will need a follow up appointment in 1 year.  Please call our office 2 months in advance to schedule this appointment.  You may see [name], MD or one of the following Advanced Practice Providers on your designated Care Team:   Truitt Merle, NP Cecilie Kicks, NP . Kathyrn Drown, NP  Thank you for choosing [facility]!!    Please arrive at the [facility] main entrance of [facility] at xx:xx AM (30-45 minutes prior to test start time)  [facility] [place], [place] [address] [phone]  Proceed to the [facility] Radiology Department (First Floor).  Please follow these instructions carefully (unless otherwise directed):  Hold all erectile dysfunction medications at least 48 hours prior to test.  On the Night Before the Test: . Be sure to Drink plenty of water. . Do not consume any caffeinated/decaffeinated beverages or chocolate 12 hours prior to your test. . Do not take any antihistamines 12 hours prior to your test. . If you take Metformin do not take 24 hours prior to test.   On the Day of the Test: . Drink plenty of water. Do not drink any water within one hour of the test. . Do not eat any food 4 hours prior to the test. . You may take your regular medications prior to the test.  . Take Verapamil two hours prior to test. . HOLD Furosemide/Hydrochlorothiazide morning of the test.      After the Test: . Drink plenty of water. . After receiving IV contrast, you may experience a mild flushed feeling. This is normal. . On occasion, you may experience a mild rash up to 24 hours after the test. This is not dangerous. If this occurs, you can take Benadryl 25 mg and increase your fluid intake. . If you experience trouble breathing, this can be serious. If it is severe call 911 IMMEDIATELY. If it is mild, please call our office. . If you take any of these medications: Glipizide/Metformin, Avandament, Glucavance, please do not take 48 hours after completing test.

## 2018-12-02 NOTE — Progress Notes (Signed)
Cardiology Office Note:    Date:  12/02/2018   ID:  Oscar LeaderJody Woodard, DOB 03/02/1956, MRN [id number]  PCP:  Darrow BussingKoirala, Dibas, MD  Cardiologist:  Donato SchultzMark Chino Sardo, MD  Electrophysiologist:  None   Referring MD: Darrow BussingKoirala, Dibas, MD   Chief complaint: Chest heaviness  History of Present Illness:    Oscar Woodard is a 63 y.o. male with brother had myocardial infarction at 6856, father who had MI at 7456, family history, with hyperlipidemia on atorvastatin here for evaluation of chest heaviness.  With COVID-19, has been under increased stress.  In Feb -ear issue. Ear clogging, HA left eye/head. Dr. Threasa BeardsYang neuro, HA, pressure, Verapamil. Helped. MRI normal.  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 Stress, related chest heavy, 30 min goes away or if distracted goes away. Feels well now. Not food or exercise related. Mom died.   Back in his 2020s he had Raynaud's phenomenon, blue fingertips.  He is also prone to dehydration he states. New consulting job.       Past Medical History:  Diagnosis Date  . Headache   . High cholesterol     Past Surgical History:  Procedure Laterality Date  . NO PAST SURGERIES      Current Medications: Current Meds  Medication Sig  . aspirin EC 81 MG tablet Take 81 mg by mouth daily.  . ASPIRIN-CALCIUM CARBONATE PO Take by mouth. 81 mg 300mg   . atorvastatin (LIPITOR) 20 MG tablet Take 1 tablet (20 mg total) by mouth daily.  . verapamil (CALAN) 120 MG tablet Take 1 tablet (120 mg total) by mouth 2 (two) times daily.     Allergies:   Cefaclor, Penicillins, and Amoxicillin   Social History   Socioeconomic History  . Marital status: Married    Spouse name: Not on file  . Number of children: Not on file  . Years of education: Not on file  . Highest education level: Not on file  Occupational History  . Not on file  Social Needs  . Financial resource strain: Not on file  . Food insecurity    Worry: Not on file    Inability: Not on file  .  Transportation needs    Medical: Not on file    Non-medical: Not on file  Tobacco Use  . Smoking status: Never Smoker  . Smokeless tobacco: Never Used  Substance and Sexual Activity  . Alcohol use: Yes  . Drug use: Not on file  . Sexual activity: Not on file  Lifestyle  . Physical activity    Days per week: Not on file    Minutes per session: Not on file  . Stress: Not on file  Relationships  . Social Musicianconnections    Talks on phone: Not on file    Gets together: Not on file    Attends religious service: Not on file    Active member of club or organization: Not on file    Attends meetings of clubs or organizations: Not on file    Relationship status: Not on file  Other Topics Concern  . Not on file  Social History Narrative  . Not on file     Family History: The patient's family history includes Heart attack (age of onset: 6056) in his brother and father.  ROS:   Please see the history of present illness.    Denies any fevers chills nausea vomiting syncope bleeding all other systems reviewed and are negative.  EKGs/Labs/Other Studies Reviewed:  The following studies were reviewed today:  Exercise treadmill test 2015-low risk no ischemia  MRI of brain and neck normal.  EKG:  EKG is  ordered today.  The ekg ordered today demonstrates sinus bradycardia 53 with no other significant abnormalities.  Prior EKG from 11/06/2017 showed sinus rhythm with 60 bpm no other abnormalities.  Recent Labs: 10/14/2018: ALT 31; BUN 19; Creatinine, Ser 0.94; Hemoglobin 15.1; Platelets 241; Potassium 4.6; Sodium 144; TSH 3.260  Recent Lipid Panel No results found for: CHOL, TRIG, HDL, CHOLHDL, VLDL, LDLCALC, LDLDIRECT  Physical Exam:    VS:  BP 118/80   Pulse (!) 53   Ht 5\' 9"  (1.753 m)   Wt 210 lb 12.8 oz (95.6 kg)   SpO2 99%   BMI 31.13 kg/m     Wt Readings from Last 3 Encounters:  12/02/18 210 lb 12.8 oz (95.6 kg)  11/08/18 212 lb 8 oz (96.4 kg)  11/06/17 206 lb 12.8 oz (93.8  kg)     GEN:  Well nourished, well developed in no acute distress HEENT: Normal NECK: No JVD; No carotid bruits LYMPHATICS: No lymphadenopathy CARDIAC: RRR, no murmurs, rubs, gallops RESPIRATORY:  Clear to auscultation without rales, wheezing or rhonchi  ABDOMEN: Soft, non-tender, non-distended MUSCULOSKELETAL:  No edema; No deformity  SKIN: Warm and dry NEUROLOGIC:  Alert and oriented x 3 PSYCHIATRIC:  Normal affect   ASSESSMENT:    1. Chest pain of uncertain etiology   2. Family history of early CAD   3. Pure hypercholesterolemia   4. Pre-procedure lab exam    PLAN:    In order of problems listed above:  Chest discomfort in the setting of strong family history of MI - Prior treadmill test 2015 reassuring.  His brother was asymptomatic and had a widow maker lesion prior to his MI.  Since he is having occasional symptoms, we will proceed with coronary CT with possible FFR analysis.  Since his heart rate is currently 53 on verapamil 120 mg once a day, we will continue this.  Does not look like he needs any metoprolol prior to CT scan.  Hyperlipidemia - Atorvastatin 20 mg, LDL 64, less than 70 previously.  Plaque stabilization.   Medication Adjustments/Labs and Tests Ordered: Current medicines are reviewed at length with the patient today.  Concerns regarding medicines are outlined above.  Orders Placed This Encounter  Procedures  . CT CORONARY MORPH W/CTA COR W/SCORE W/CA W/CM &/OR WO/CM  . CT CORONARY FRACTIONAL FLOW RESERVE DATA PREP  . CT CORONARY FRACTIONAL FLOW RESERVE FLUID ANALYSIS  . Basic metabolic panel  . EKG 12-Lead   No orders of the defined types were placed in this encounter.   Patient Instructions  Medication Instructions:  The current medical regimen is effective;  continue present plan and medications.  If you need a refill on your cardiac medications before your next appointment, please call your pharmacy.   Lab work: You will lab work prior to  your Coronary CT (BMP) If you have labs (blood work) drawn today and your tests are completely normal, you will receive your results only by: Marland Kitchen. MyChart Message (if you have MyChart) OR . A paper copy in the mail If you have any lab test that is abnormal or we need to change your treatment, we will call you to review the results.  Testing/Procedures: Your physician has requested that you have Coronary CT. Cardiac computed tomography (CT) is a painless test that uses an x-ray machine to take clear,  detailed pictures of your heart. For further information please visit HugeFiesta.tn. Please follow instruction sheet as given.  You will be called to be scheduled for this testing.  Follow-Up: At [facility], you and your health needs are our priority.  As part of our continuing mission to provide you with exceptional heart care, we have created designated Provider Care Teams.  These Care Teams include your primary Cardiologist (physician) and Advanced Practice Providers (APPs -  Physician Assistants and Nurse Practitioners) who all work together to provide you with the care you need, when you need it. You will need a follow up appointment in 1 year.  Please call our office 2 months in advance to schedule this appointment.  You may see [name], MD or one of the following Advanced Practice Providers on your designated Care Team:   Truitt Merle, NP Cecilie Kicks, NP . Kathyrn Drown, NP  Thank you for choosing [facility]!!    Please arrive at the [facility] main entrance of [facility] at xx:xx AM (30-45 minutes prior to test start time)  [facility] [place], [place] [address] [phone]  Proceed to the [facility] Radiology Department (First Floor).  Please follow these instructions carefully (unless otherwise directed):  Hold all erectile dysfunction medications at least 48 hours prior to test.  On the Night Before the Test: . Be  sure to Drink plenty of water. . Do not consume any caffeinated/decaffeinated beverages or chocolate 12 hours prior to your test. . Do not take any antihistamines 12 hours prior to your test. . If you take Metformin do not take 24 hours prior to test.   On the Day of the Test: . Drink plenty of water. Do not drink any water within one hour of the test. . Do not eat any food 4 hours prior to the test. . You may take your regular medications prior to the test.  . Take Verapamil two hours prior to test. . HOLD Furosemide/Hydrochlorothiazide morning of the test.      After the Test: . Drink plenty of water. . After receiving IV contrast, you may experience a mild flushed feeling. This is normal. . On occasion, you may experience a mild rash up to 24 hours after the test. This is not dangerous. If this occurs, you can take Benadryl 25 mg and increase your fluid intake. . If you experience trouble breathing, this can be serious. If it is severe call 911 IMMEDIATELY. If it is mild, please call our office. . If you take any of these medications: Glipizide/Metformin, Avandament, Glucavance, please do not take 48 hours after completing test.      Signed, [name], MD  12/02/2018 8:53 AM    [place]

## 2018-12-22 ENCOUNTER — Other Ambulatory Visit: Payer: Self-pay | Admitting: *Deleted

## 2018-12-22 DIAGNOSIS — E7800 Pure hypercholesterolemia, unspecified: Secondary | ICD-10-CM

## 2018-12-22 DIAGNOSIS — Z79899 Other long term (current) drug therapy: Secondary | ICD-10-CM

## 2018-12-29 ENCOUNTER — Other Ambulatory Visit: Payer: Self-pay

## 2018-12-29 ENCOUNTER — Other Ambulatory Visit: Payer: 59 | Admitting: *Deleted

## 2018-12-29 DIAGNOSIS — E7800 Pure hypercholesterolemia, unspecified: Secondary | ICD-10-CM

## 2018-12-29 DIAGNOSIS — Z79899 Other long term (current) drug therapy: Secondary | ICD-10-CM

## 2018-12-29 DIAGNOSIS — R079 Chest pain, unspecified: Secondary | ICD-10-CM

## 2018-12-29 DIAGNOSIS — Z8249 Family history of ischemic heart disease and other diseases of the circulatory system: Secondary | ICD-10-CM

## 2018-12-29 DIAGNOSIS — Z01812 Encounter for preprocedural laboratory examination: Secondary | ICD-10-CM

## 2018-12-29 LAB — LIPID PANEL
Chol/HDL Ratio: 2.7 ratio (ref 0.0–5.0)
Cholesterol, Total: 147 mg/dL (ref 100–199)
HDL: 54 mg/dL (ref >39)
LDL Calculated: 75 mg/dL (ref 0–99)
Triglycerides: 91 mg/dL (ref 0–149)
VLDL Cholesterol Cal: 18 mg/dL (ref 5–40)

## 2018-12-29 LAB — BASIC METABOLIC PANEL
BUN/Creatinine Ratio: 20 (ref 10–24)
BUN: 20 mg/dL (ref 8–27)
CO2: 23 mmol/L (ref 20–29)
Calcium: 9.4 mg/dL (ref 8.6–10.2)
Chloride: 102 mmol/L (ref 96–106)
Creatinine, Ser: 1.02 mg/dL (ref 0.76–1.27)
GFR calc Af Amer: 92 mL/min/{1.73_m2} (ref >59)
GFR calc non Af Amer: 80 mL/min/{1.73_m2} (ref >59)
Glucose: 86 mg/dL (ref 65–99)
Potassium: 4.3 mmol/L (ref 3.5–5.2)
Sodium: 139 mmol/L (ref 134–144)

## 2018-12-29 LAB — ALT: ALT: 20 IU/L (ref 0–44)

## 2019-01-05 ENCOUNTER — Ambulatory Visit (HOSPITAL_COMMUNITY): Payer: 59

## 2019-01-06 ENCOUNTER — Telehealth (HOSPITAL_COMMUNITY): Payer: Self-pay | Admitting: Emergency Medicine

## 2019-01-06 NOTE — Telephone Encounter (Signed)
Patient returning phone call regarding upcoming cardiac imaging study; pt verbalizes understanding of appt date/time, parking situation and where to check in, pre-test NPO status and medications ordered, and verified current allergies; name and call back number provided for further questions should they arise Marchia Bond RN Navigator Cardiac Imaging [facility] [phone] office [phone] cell

## 2019-01-06 NOTE — Telephone Encounter (Signed)
Left message on voicemail with name and callback number [name] RN Navigator Cardiac Imaging [facility] [phone] Office [phone] Cell  

## 2019-01-07 ENCOUNTER — Telehealth: Payer: Self-pay

## 2019-01-07 ENCOUNTER — Encounter (HOSPITAL_COMMUNITY): Payer: Self-pay

## 2019-01-07 ENCOUNTER — Ambulatory Visit (HOSPITAL_COMMUNITY): Admission: RE | Admit: 2019-01-07 | Payer: 59 | Source: Ambulatory Visit

## 2019-01-07 ENCOUNTER — Other Ambulatory Visit: Payer: Self-pay

## 2019-01-07 ENCOUNTER — Ambulatory Visit (HOSPITAL_COMMUNITY)
Admission: RE | Admit: 2019-01-07 | Discharge: 2019-01-07 | Disposition: A | Discharge status: Home / Self Care | Payer: 59 | Source: Ambulatory Visit | Attending: Cardiology | Admitting: Cardiology

## 2019-01-07 DIAGNOSIS — Z8249 Family history of ischemic heart disease and other diseases of the circulatory system: Secondary | ICD-10-CM | POA: Insufficient documentation

## 2019-01-07 DIAGNOSIS — R079 Chest pain, unspecified: Secondary | ICD-10-CM

## 2019-01-07 DIAGNOSIS — R0789 Other chest pain: Secondary | ICD-10-CM | POA: Diagnosis present

## 2019-01-07 DIAGNOSIS — E7800 Pure hypercholesterolemia, unspecified: Secondary | ICD-10-CM

## 2019-01-07 MED ORDER — IOHEXOL 350 MG/ML SOLN
80.0000 mL | Freq: Once | INTRAVENOUS | Status: AC | PRN
  Administered 2019-01-07: 80 mL via INTRAVENOUS

## 2019-01-07 MED ORDER — NITROGLYCERIN 0.4 MG SL SUBL
SUBLINGUAL_TABLET | SUBLINGUAL | Status: AC
  Filled 2019-01-07: qty 2

## 2019-01-07 MED ORDER — NITROGLYCERIN 0.4 MG SL SUBL
0.8000 mg | SUBLINGUAL_TABLET | Freq: Once | SUBLINGUAL | Status: AC
  Administered 2019-01-07: 0.8 mg via SUBLINGUAL
  Filled 2019-01-07: qty 25

## 2019-01-07 NOTE — Telephone Encounter (Signed)
-----   Message from Jerline Pain, MD sent at 01/07/2019  4:29 PM EDT ----- IMPRESSION: 1. Coronary calcium score of 89. This was 0 percentile for age and sex matched control.  2. Normal coronary origin with right dominance.  3. CAD-RADS 1. Minimal non-obstructive CAD (0-24%) in the proximal LAD. Consider non-atherosclerotic causes of chest pain. Consider preventive therapy and risk factor modification.  4.  A PFO is seen.   - Overall reassuring. No flow limiting CAD  - Continue atorvastatin  - Continue ASA (also helpful with PFO - seen in 20-25% of the population). Should continue to be of no clinical significance.   Thanks  [name], MD

## 2019-01-07 NOTE — Telephone Encounter (Signed)
The patient has been notified of the result and verbalized understanding.  All questions (if any) were answered. Wilma Flavin, RN 01/07/2019 4:34 PM

## 2019-01-07 NOTE — Telephone Encounter (Signed)
The patient has been notified of the result and verbalized understanding.  All questions (if any) were answered. Wilma Flavin, RN 01/07/2019 4:44 PM

## 2019-02-25 ENCOUNTER — Other Ambulatory Visit: Payer: Self-pay | Admitting: Cardiology

## 2019-12-05 ENCOUNTER — Other Ambulatory Visit: Payer: Self-pay | Admitting: Cardiology

## 2020-01-03 ENCOUNTER — Other Ambulatory Visit: Payer: Self-pay | Admitting: Cardiology

## 2020-01-18 ENCOUNTER — Encounter: Payer: Self-pay | Admitting: Cardiology

## 2020-01-18 ENCOUNTER — Other Ambulatory Visit: Payer: Self-pay

## 2020-01-18 ENCOUNTER — Ambulatory Visit: Payer: 59 | Admitting: Cardiology

## 2020-01-18 VITALS — BP 140/90 | HR 46 | Ht 69.0 in | Wt 210.0 lb

## 2020-01-18 DIAGNOSIS — I2510 Atherosclerotic heart disease of native coronary artery without angina pectoris: Secondary | ICD-10-CM | POA: Diagnosis not present

## 2020-01-18 DIAGNOSIS — Z8249 Family history of ischemic heart disease and other diseases of the circulatory system: Secondary | ICD-10-CM

## 2020-01-18 DIAGNOSIS — R079 Chest pain, unspecified: Secondary | ICD-10-CM

## 2020-01-18 DIAGNOSIS — E7800 Pure hypercholesterolemia, unspecified: Secondary | ICD-10-CM

## 2020-01-18 MED ORDER — ROSUVASTATIN CALCIUM 20 MG PO TABS
20.0000 mg | ORAL_TABLET | Freq: Every day | ORAL | 3 refills | Status: DC
Start: 2020-01-18 — End: 2020-05-08

## 2020-01-18 NOTE — Progress Notes (Signed)
Cardiology Office Note:    Date:  01/18/2020   ID:  Oscar Woodard, DOB 06-03-1955, MRN [id number]  PCP:  Darrow Bussing, MD  [facility] Cardiologist:  Donato Schultz, MD  [facility] Electrophysiologist:  None   Referring MD: Darrow Bussing, MD    History of Present Illness:    Oscar Woodard is a 64 y.o. male here for follow-up early family history of coronary artery disease, brother MI 86 father MI 71.  Atorvastatin for hyperlipidemia.  Prior evaluation of chest heaviness.  Coronary CT scan 01/07/2019: 1. Coronary calcium score of 89. This was 0 percentile for age and sex matched control.  2. Normal coronary origin with right dominance.  3. CAD-RADS 1. Minimal non-obstructive CAD (0-24%) in the proximal LAD. Consider non-atherosclerotic causes of chest pain. Consider preventive therapy and risk factor modification.  Overall feeling well with no chest pain no fevers no chills no nausea.  Clint Guy, a patient of mine is his wife's mother.  Occasionally when he is over there he will check his blood pressure.  It was just a little bit elevated today here in the clinic.  140/90.  Previously worked at [employer].  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  He is going to need to get on the exchange for health insurance.  Prices for this range from $1800 a month to $6300 a month for the gold plan.    Past Medical History:  Diagnosis Date   Headache    High cholesterol     Past Surgical History:  Procedure Laterality Date   NO PAST SURGERIES      Current Medications: Current Meds  Medication Sig   aspirin EC 81 MG tablet Take 81 mg by mouth daily.   [DISCONTINUED] atorvastatin (LIPITOR) 20 MG tablet TAKE 1 TABLET DAILY     Allergies:   Cefaclor, Penicillins, and Amoxicillin   Social History   Socioeconomic History   Marital status: Married    Spouse name: Not on file   Number of children: Not on file   Years of education: Not on file    Highest education level: Not on file  Occupational History   Not on file  Tobacco Use   Smoking status: Never Smoker   Smokeless tobacco: Never Used  Substance and Sexual Activity   Alcohol use: Yes   Drug use: Not on file   Sexual activity: Not on file  Other Topics Concern   Not on file  Social History Narrative   Not on file   Social Determinants of Health   Financial Resource Strain:    Difficulty of Paying Living Expenses: Not on file  Food Insecurity:    Worried About Running Out of Food in the Last Year: Not on file   Ran Out of Food in the Last Year: Not on file  Transportation Needs:    Lack of Transportation (Medical): Not on file   Lack of Transportation (Non-Medical): Not on file  Physical Activity:    Days of Exercise per Week: Not on file   Minutes of Exercise per Session: Not on file  Stress:    Feeling of Stress : Not on file  Social Connections:    Frequency of Communication with Friends and Family: Not on file   Frequency of Social Gatherings with Friends and Family: Not on file   Attends Religious Services: Not on file   Active Member of Clubs or Organizations: Not on file   Attends Banker  Meetings: Not on file   Marital Status: Not on file     Family History: The patient's family history includes Heart attack (age of onset: 33) in his brother and father.  ROS:   Please see the history of present illness.     All other systems reviewed and are negative.  EKGs/Labs/Other Studies Reviewed:    The following studies were reviewed today: CT scan once again reviewed as above.  LDL 89 hemoglobin A1c 5.6 hemoglobin 15.1 creatinine 1.0  EKG:  EKG is  ordered today.  The ekg ordered today demonstrates sinus bradycardia 46 no other abnormalities  Recent Labs: No results found for requested labs within last 8760 hours.  Recent Lipid Panel    Component Value Date/Time   CHOL 147 12/29/2018 0936   TRIG 91 12/29/2018  0936   HDL 54 12/29/2018 0936   CHOLHDL 2.7 12/29/2018 0936   LDLCALC 75 12/29/2018 0936    Physical Exam:    VS:  BP 140/90    Pulse (!) 46    Ht 5\' 9"  (1.753 m)    Wt 210 lb (95.3 kg)    SpO2 96%    BMI 31.01 kg/m     Wt Readings from Last 3 Encounters:  01/18/20 210 lb (95.3 kg)  12/02/18 210 lb 12.8 oz (95.6 kg)  11/08/18 212 lb 8 oz (96.4 kg)     GEN:  Well nourished, well developed in no acute distress HEENT: Normal NECK: No JVD; No carotid bruits LYMPHATICS: No lymphadenopathy CARDIAC: RRR, no murmurs, rubs, gallops RESPIRATORY:  Clear to auscultation without rales, wheezing or rhonchi  ABDOMEN: Soft, non-tender, non-distended MUSCULOSKELETAL:  No edema; No deformity  SKIN: Warm and dry NEUROLOGIC:  Alert and oriented x 3 PSYCHIATRIC:  Normal affect   ASSESSMENT:    1. Coronary artery disease involving native coronary artery of native heart without angina pectoris   2. Family history of early CAD   3. Chest pain of uncertain etiology   4. Pure hypercholesterolemia    PLAN:    In order of problems listed above:  CAD mild nonflow limiting calcified plaque on CT scan reviewed as above.  Continue with aggressive risk factor modification.  Continue with aspirin.  No bleeding.  Hyperlipidemia -We are going to switch him from atorvastatin 20 over to Crestor 20.  Repeat lipid panel in 3 months.  Goal LDL less than 70.  Family history of CAD -Multiple family members MI age 64.  Brother, father.  He will watch his blood pressure.  Usually it is under good range.  Medication Adjustments/Labs and Tests Ordered: Current medicines are reviewed at length with the patient today.  Concerns regarding medicines are outlined above.  Orders Placed This Encounter  Procedures   EKG 12-Lead   Meds ordered this encounter  Medications   rosuvastatin (CRESTOR) 20 MG tablet    Sig: Take 1 tablet (20 mg total) by mouth daily.    Dispense:  90 tablet    Refill:  3     Patient Instructions  Medication Instructions:  Please discontinue your Atorvastatin and start Crestor 20 mg a day. Continue all other medications as listed.  *If you need a refill on your cardiac medications before your next appointment, please call your pharmacy*  Lab Work: Please have blood work in 3 months (Lipid)  If you have labs (blood work) drawn today and your tests are completely normal, you will receive your results only by:  MyChart Message (if you have  MyChart) OR  A paper copy in the mail If you have any lab test that is abnormal or we need to change your treatment, we will call you to review the results.  Follow-Up: At [facility], you and your health needs are our priority.  As part of our continuing mission to provide you with exceptional heart care, we have created designated Provider Care Teams.  These Care Teams include your primary Cardiologist (physician) and Advanced Practice Providers (APPs -  Physician Assistants and Nurse Practitioners) who all work together to provide you with the care you need, when you need it.  We recommend signing up for the patient portal called "MyChart".  Sign up information is provided on this After Visit Summary.  MyChart is used to connect with patients for Virtual Visits (Telemedicine).  Patients are able to view lab/test results, encounter notes, upcoming appointments, etc.  Non-urgent messages can be sent to your provider as well.   To learn more about what you can do with MyChart, go to [email or web address].    Your next appointment:   12 month(s)  The format for your next appointment:   In Person  Provider:   Donato Schultz, MD   Thank you for choosing [facility]!!         Signed, Donato Schultz, MD  01/18/2020 5:02 PM    [facility]

## 2020-01-18 NOTE — Patient Instructions (Signed)
Medication Instructions:  Please discontinue your Atorvastatin and start Crestor 20 mg a day. Continue all other medications as listed.  *If you need a refill on your cardiac medications before your next appointment, please call your pharmacy*  Lab Work: Please have blood work in 3 months (Lipid)  If you have labs (blood work) drawn today and your tests are completely normal, you will receive your results only by: Marland Kitchen MyChart Message (if you have MyChart) OR . A paper copy in the mail If you have any lab test that is abnormal or we need to change your treatment, we will call you to review the results.  Follow-Up: At [facility], you and your health needs are our priority.  As part of our continuing mission to provide you with exceptional heart care, we have created designated Provider Care Teams.  These Care Teams include your primary Cardiologist (physician) and Advanced Practice Providers (APPs -  Physician Assistants and Nurse Practitioners) who all work together to provide you with the care you need, when you need it.  We recommend signing up for the patient portal called "MyChart".  Sign up information is provided on this After Visit Summary.  MyChart is used to connect with patients for Virtual Visits (Telemedicine).  Patients are able to view lab/test results, encounter notes, upcoming appointments, etc.  Non-urgent messages can be sent to your provider as well.   To learn more about what you can do with MyChart, go to [email or web address].    Your next appointment:   12 month(s)  The format for your next appointment:   In Person  Provider:   Donato Schultz, MD   Thank you for choosing [facility]!!

## 2020-01-19 ENCOUNTER — Other Ambulatory Visit: Payer: Self-pay | Admitting: Cardiology

## 2020-01-26 ENCOUNTER — Other Ambulatory Visit: Payer: Self-pay | Admitting: Cardiology

## 2020-02-23 ENCOUNTER — Other Ambulatory Visit: Payer: Self-pay | Admitting: Dermatology

## 2020-02-23 NOTE — Telephone Encounter (Signed)
Patient called needing refill on Finacea Foam-denied patient needs office visit; left voice message to inform patient.

## 2020-02-23 NOTE — Telephone Encounter (Signed)
Per wife Lawson Fiscal she has already asked ST for refill and he told her to call and put in a request for refill. Medication is finacea foam to be sent to [facility] on [address].

## 2020-03-02 ENCOUNTER — Other Ambulatory Visit: Payer: Self-pay | Admitting: *Deleted

## 2020-03-02 MED ORDER — FINACEA 15 % EX FOAM
1.0000 | Freq: Every day | CUTANEOUS | 5 refills | Status: DC
Start: 2020-03-02 — End: 2020-03-19

## 2020-03-02 NOTE — Progress Notes (Signed)
Per Dr. Jorja Loa ok to refill patients Finacea foam. Sent to patients pharmacy.

## 2020-03-06 ENCOUNTER — Other Ambulatory Visit: Payer: Self-pay | Admitting: *Deleted

## 2020-03-06 DIAGNOSIS — E7800 Pure hypercholesterolemia, unspecified: Secondary | ICD-10-CM

## 2020-03-19 ENCOUNTER — Other Ambulatory Visit: Payer: Self-pay | Admitting: *Deleted

## 2020-03-19 MED ORDER — FINACEA 15 % EX FOAM: 1.0000 "application " | Freq: Every day | CUTANEOUS | 5 refills | Status: DC

## 2020-04-20 ENCOUNTER — Other Ambulatory Visit: Payer: 59

## 2020-05-01 ENCOUNTER — Other Ambulatory Visit: Payer: 59

## 2020-05-08 ENCOUNTER — Other Ambulatory Visit: Payer: Self-pay

## 2020-05-08 MED ORDER — ROSUVASTATIN CALCIUM 20 MG PO TABS: 20.0000 mg | ORAL_TABLET | Freq: Every day | ORAL | 2 refills | Status: DC

## 2020-10-03 IMAGING — MR MR BRAIN/IAC WITHOUT AND WITH CONTRAST
11 of 12 series · 44 of 48 positions shown · IV contrast (multihance)
Comparison: None.

CLINICAL DATA: Facial nerve paresis. Left eye drooping and ear
fullness since [REDACTED].

Creatinine was obtained on site at [HOSPITAL] at [HOSPITAL].
Results: Creatinine 1.1 mg/dL.
EXAM:
MRI HEAD WITHOUT AND WITH CONTRAST
TECHNIQUE: Multiplanar, multiecho pulse sequences of the brain and surrounding
structures were obtained without and with intravenous contrast.
CONTRAST:  19mL MULTIHANCE GADOBENATE DIMEGLUMINE 529 MG/ML IV SOLN

[Series 5: T1 · sagittal · 4.0mm · 0.72mm/px · 3 of 27 slices shown (1 of 3)]
[im 1/27]
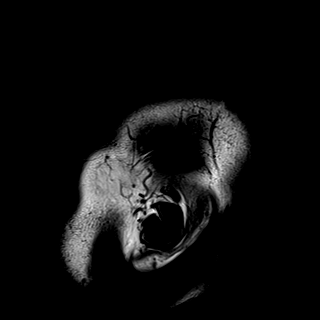
[im 14/27]
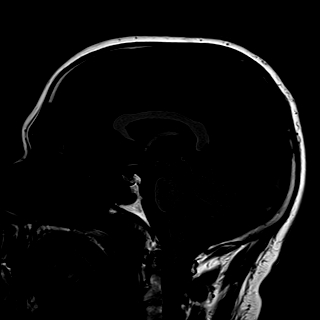
[im 27/27]
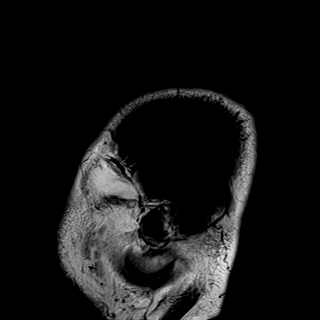

[Series 6: T2 · axial · 4.0mm · 0.36mm/px · z∈[-71,+69]mm · 2 of 28 slices shown]
[im 1/28]
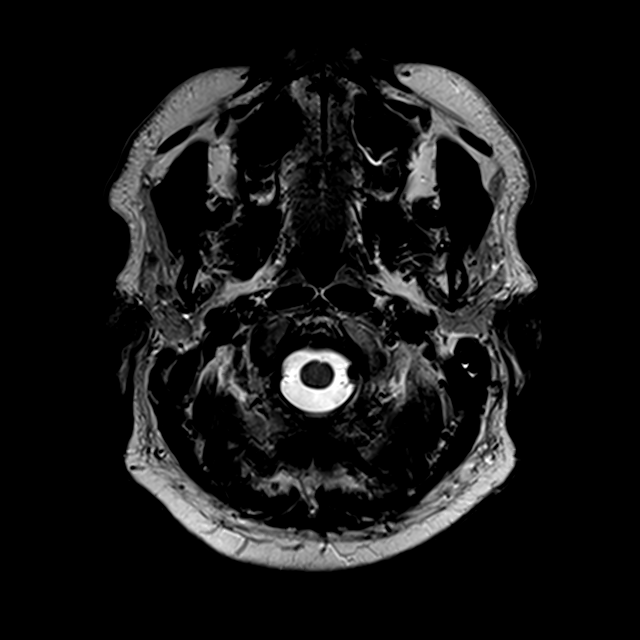
[im 28/28]
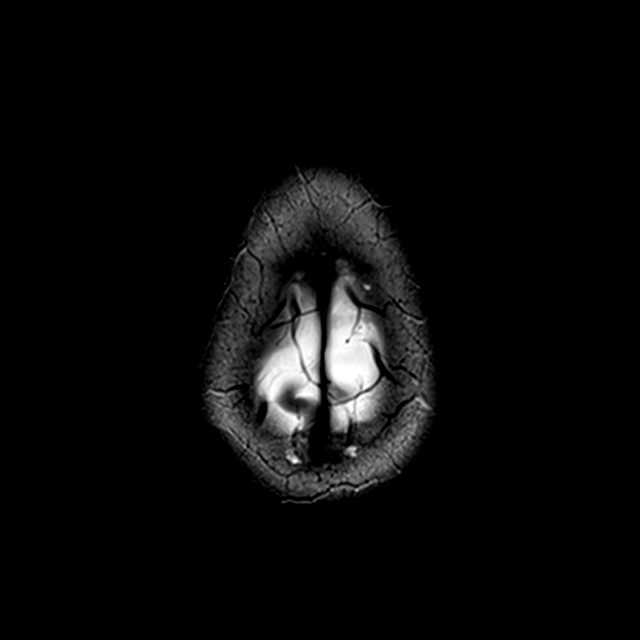

[Series 7: DWI · axial · 3.0mm · 1.44mm/px · z∈[-72,+70]mm · 7 of 88 slices shown (1 of 2)]
[im 1/88]
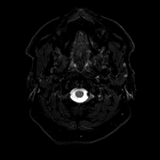
[im 15/88]
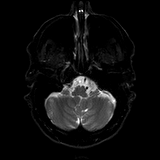
[im 30/88]
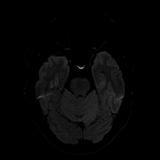
[im 44/88]
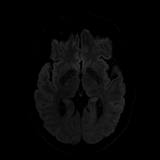
[im 59/88]
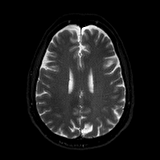
[im 73/88]
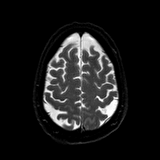
[im 88/88]
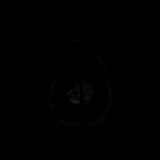

[Series 8: DWI · axial · 3.0mm · 1.44mm/px · z∈[-72,+70]mm · 4 of 44 slices shown (2 of 2)]
[im 1/44]
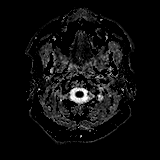
[im 15/44]
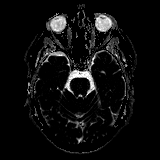
[im 29/44]
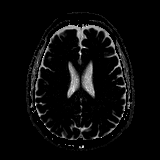
[im 44/44]
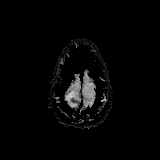

[Series 10: swi_images · axial · 2.0mm · 0.90mm/px · z∈[-81,+77]mm · 7 of 80 slices shown]
[im 1/80]
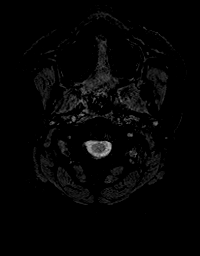
[im 14/80]
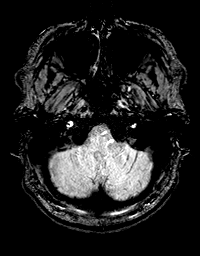
[im 27/80]
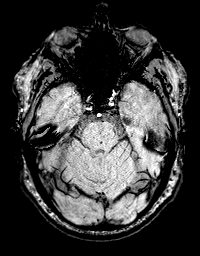
[im 40/80]
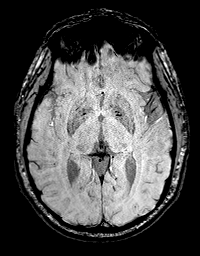
[im 53/80]
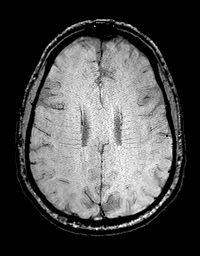
[im 66/80]
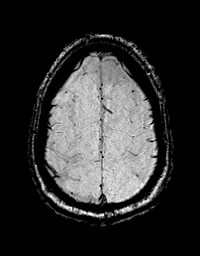
[im 80/80]
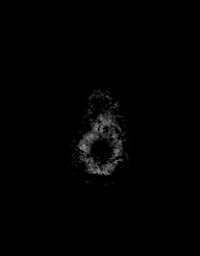

[Series 11: T1 · coronal · 2.5mm · 0.56mm/px · 2 of 20 slices shown (2 of 3)]
[im 1/20]
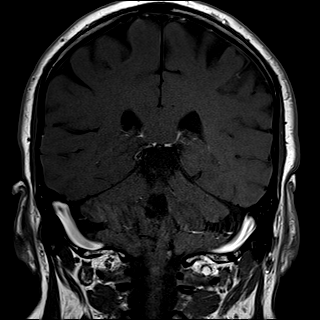
[im 20/20]
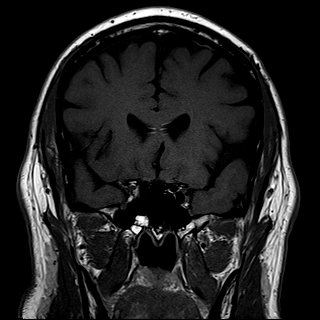

[Series 12: FLAIR · axial · 3.0mm · 0.72mm/px · z∈[-82,+80]mm · 2 of 28 slices shown]
[im 1/28]
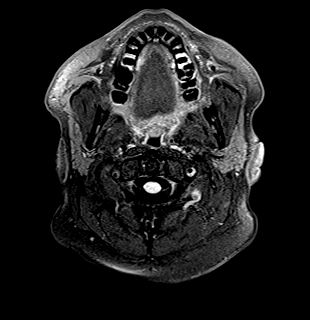
[im 28/28]
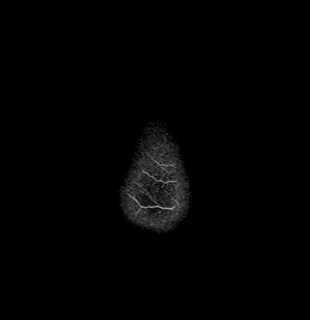

[Series 13: T1 · axial · 2.5mm · 0.50mm/px · 1 of 13 slices shown (3 of 3)]
[im 1/13]
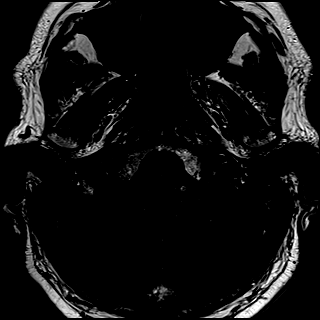

[Series 15: T1 post-contrast · coronal · 2.5mm · 0.56mm/px · 2 of 20 slices shown (1 of 3)]
[im 1/20]
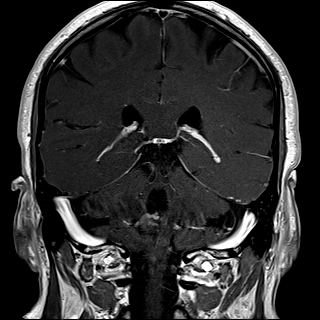
[im 20/20]
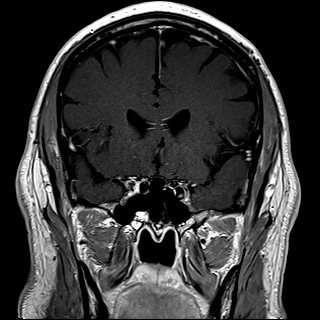

[Series 16: T1 post-contrast · axial · 2.5mm · 0.50mm/px · 1 of 13 slices shown (2 of 3)]
[im 1/13]
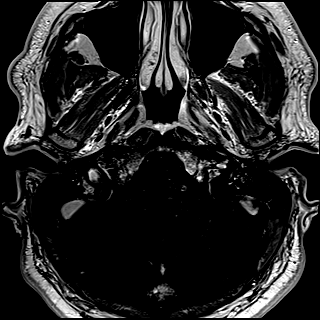

[Series 17: T1 post-contrast · axial · 1.0mm · 0.90mm/px · z∈[-81,+78]mm · 13 of 160 slices shown (3 of 3)]
[im 1/160]
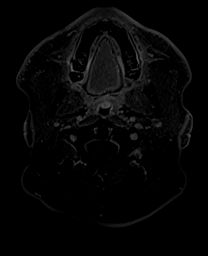
[im 14/160]
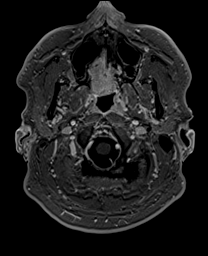
[im 27/160]
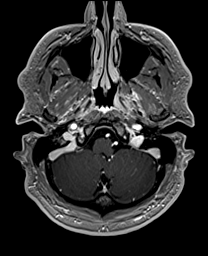
[im 40/160]
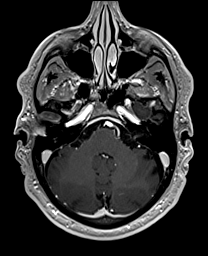
[im 54/160]
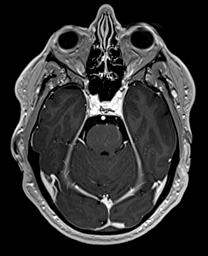
[im 67/160]
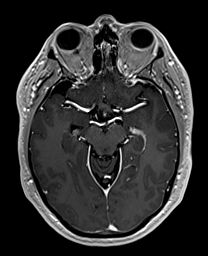
[im 80/160]
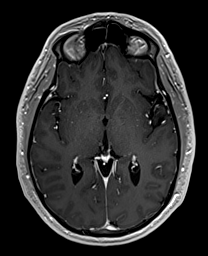
[im 93/160]
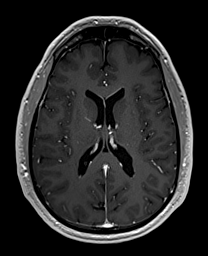
[im 107/160]
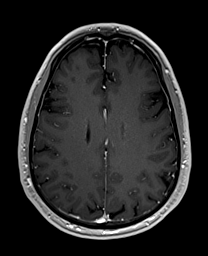
[im 120/160]
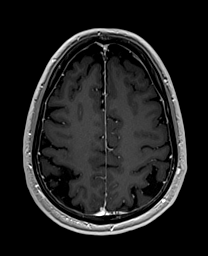
[im 133/160]
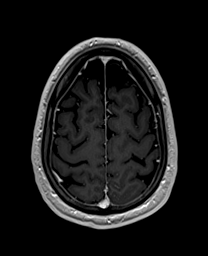
[im 146/160]
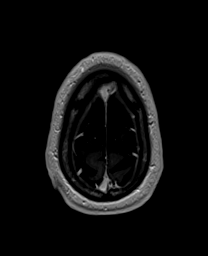
[im 160/160]
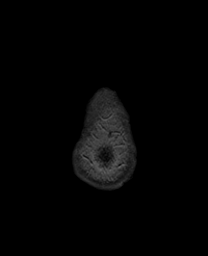

[44 of 48 positions shown; findings below may reference images not displayed]

FINDINGS: Brain: There is no evidence of acute infarct, intracranial
hemorrhage, midline shift, or extra-axial fluid collection. The
ventricles and sulci are within normal limits for age. The brain is
normal in signal. Asymmetric extra-axial CSF in the right middle
cranial fossa anterior to the temporal tip measures 25 x 6 mm and
may represent a small arachnoid cyst, of no clinical significance.

Dedicated imaging through the internal auditory canals demonstrates
a normal course of cranial nerves VII and VIII without evidence of
mass or abnormal enhancement. Inner ear structures demonstrate
normal signal bilaterally. No mass is seen within the
cerebellopontine angles.

Vascular: Major intracranial vascular flow voids are preserved.

Skull and upper cervical spine: Unremarkable bone marrow signal.

Sinuses/Orbits: Unremarkable orbits. Clear paranasal sinuses. Small
left mastoid effusion.

Other: None.
IMPRESSION: 1. Unremarkable appearance of the brain and internal auditory
canals.
2. Small left mastoid effusion.

## 2021-03-07 NOTE — Progress Notes (Signed)
Cardiology Office Note:    Date:  03/08/2021   ID:  Oscar Woodard, DOB 10/28/1955, MRN [id number]  PCP:  Darrow Bussing, MD  [facility] Cardiologist:  Donato Schultz, MD  [facility] Electrophysiologist:  None   Referring MD: Darrow Bussing, MD    History of Present Illness:    Oscar Woodard is a 64 y.o. male here for the follow-up of hypertension.   Early family history of coronary artery disease, brother MI 97 father MI 73.  Atorvastatin for hyperlipidemia.  Prior evaluation of chest heaviness.  Coronary CT scan 01/07/2019: 1. Coronary calcium score of 89. This was 0 percentile for age and sex matched control.   2. Normal coronary origin with right dominance.   3. CAD-RADS 1. Minimal non-obstructive CAD (0-24%) in the proximal LAD. Consider non-atherosclerotic causes of chest pain. Consider preventive therapy and risk factor modification.   At his last appointment he was overall feeling well. Clint Guy, a patient of mine is his wife's mother.  Occasionally when he is over there he will check his blood pressure.  It was just a little bit elevated here in the clinic. 140/90.  Previously worked at [employer].  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  He is going to need to get on the exchange for health insurance.  Prices for this range from $1800 a month to $6300 a month for the gold plan.  Today: Overall he is feeling well, with no new complaints.  He confirms his last lipid panel was in 2021.  He denies any palpitations, chest pain, or shortness of breath. No lightheadedness, headaches, syncope, orthopnea, PND, lower extremity edema or exertional symptoms.   Past Medical History:  Diagnosis Date   Headache    High cholesterol     Past Surgical History:  Procedure Laterality Date   NO PAST SURGERIES      Current Medications: Current Meds  Medication Sig   aspirin EC 81 MG tablet Take 81 mg by mouth daily.   Azelaic Acid (FINACEA) 15 % FOAM Apply  1 application topically daily.   rosuvastatin (CRESTOR) 20 MG tablet Take 1 tablet (20 mg total) by mouth daily.     Allergies:   Cefaclor, Penicillins, and Amoxicillin   Social History   Socioeconomic History   Marital status: Married    Spouse name: Not on file   Number of children: Not on file   Years of education: Not on file   Highest education level: Not on file  Occupational History   Not on file  Tobacco Use   Smoking status: Never   Smokeless tobacco: Never  Substance and Sexual Activity   Alcohol use: Yes   Drug use: Not on file   Sexual activity: Not on file  Other Topics Concern   Not on file  Social History Narrative   Not on file   Social Determinants of Health   Financial Resource Strain: Not on file  Food Insecurity: Not on file  Transportation Needs: Not on file  Physical Activity: Not on file  Stress: Not on file  Social Connections: Not on file     Family History: The patient's family history includes Heart attack (age of onset: 46) in his brother and father.  ROS:   Please see the history of present illness.    All other systems reviewed and are negative.  EKGs/Labs/Other Studies Reviewed:    The following studies were reviewed today: CT scan once again reviewed as above.  EKG:   03/08/2021: Sinus bradycardia. Rate 51 bpm. 01/18/2020: sinus bradycardia 46 no other abnormalities  Recent Labs: No results found for requested labs within last 8760 hours.  Recent Lipid Panel    Component Value Date/Time   CHOL 147 12/29/2018 0936   TRIG 91 12/29/2018 0936   HDL 54 12/29/2018 0936   CHOLHDL 2.7 12/29/2018 0936   LDLCALC 75 12/29/2018 0936   04/11/2020: LDL 77   Physical Exam:    VS:  BP 120/80 (BP Location: Left Arm, Patient Position: Sitting, Cuff Size: Normal)   Ht 5\' 9"  (1.753 m)   Wt 199 lb (90.3 kg)   SpO2 98%   BMI 29.39 kg/m     Wt Readings from Last 3 Encounters:  03/08/21 199 lb (90.3 kg)  01/18/20 210 lb (95.3 kg)   12/02/18 210 lb 12.8 oz (95.6 kg)     GEN:  Well nourished, well developed in no acute distress HEENT: Normal NECK: No JVD; No carotid bruits LYMPHATICS: No lymphadenopathy CARDIAC: RRR, no murmurs, rubs, gallops RESPIRATORY:  Clear to auscultation without rales, wheezing or rhonchi  ABDOMEN: Soft, non-tender, non-distended MUSCULOSKELETAL:  No edema; No deformity  SKIN: Warm and dry NEUROLOGIC:  Alert and oriented x 3 PSYCHIATRIC:  Normal affect   ASSESSMENT:    1. Coronary artery disease involving native coronary artery of native heart without angina pectoris   2. Family history of early CAD   3. Pure hypercholesterolemia     PLAN:    In order of problems listed above: CAD (coronary artery disease) Mild nonflow limiting CAD on CT scan as described above.  Doing well.  Continue with aspirin, high intensity statin.  Doing very well.  No changes made.  Family history of early CAD Multiple family members had myocardial infarction, age 73, his brother his father.  Aggressive risk factor modification.  Pure hypercholesterolemia He is on Crestor 20 mg.  LDL most recently was 75-77.  He is getting it checked again soon.  If it is still above 70, I would suggest we add Zetia 10 mg once a day.  Explained to him lower the better.  Follow-up:   12 months.  Medication Adjustments/Labs and Tests Ordered: Current medicines are reviewed at length with the patient today.  Concerns regarding medicines are outlined above.  Orders Placed This Encounter  Procedures   EKG 12-Lead    No orders of the defined types were placed in this encounter.   Patient Instructions  Medication Instructions:  The current medical regimen is effective;  continue present plan and medications.  *If you need a refill on your cardiac medications before your next appointment, please call your pharmacy*  Follow-Up: At [facility], you and your health needs are our priority.  As part of our continuing  mission to provide you with exceptional heart care, we have created designated Provider Care Teams.  These Care Teams include your primary Cardiologist (physician) and Advanced Practice Providers (APPs -  Physician Assistants and Nurse Practitioners) who all work together to provide you with the care you need, when you need it.  We recommend signing up for the patient portal called "MyChart".  Sign up information is provided on this After Visit Summary.  MyChart is used to connect with patients for Virtual Visits (Telemedicine).  Patients are able to view lab/test results, encounter notes, upcoming appointments, etc.  Non-urgent messages can be sent to your provider as well.   To learn more about what you can do with  MyChart, go to [email or web address].    Your next appointment:   1 year(s)  The format for your next appointment:   In Person  Provider:   Donato Schultz, MD   Thank you for choosing [facility]!!     I,Mathew Stumpf,acting as a scribe for Donato Schultz, MD.,have documented all relevant documentation on the behalf of Donato Schultz, MD,as directed by  Donato Schultz, MD while in the presence of Donato Schultz, MD.  I, Donato Schultz, MD, have reviewed all documentation for this visit. The documentation on 03/08/21 for the exam, diagnosis, procedures, and orders are all accurate and complete.   Signed, Donato Schultz, MD  03/08/2021 10:23 AM    [facility]

## 2021-03-08 ENCOUNTER — Other Ambulatory Visit: Payer: Self-pay

## 2021-03-08 ENCOUNTER — Encounter: Payer: Self-pay | Admitting: Cardiology

## 2021-03-08 ENCOUNTER — Ambulatory Visit: Payer: BC Managed Care – PPO | Admitting: Cardiology

## 2021-03-08 VITALS — BP 120/80 | Ht 69.0 in | Wt 199.0 lb

## 2021-03-08 DIAGNOSIS — Z8249 Family history of ischemic heart disease and other diseases of the circulatory system: Secondary | ICD-10-CM

## 2021-03-08 DIAGNOSIS — I2510 Atherosclerotic heart disease of native coronary artery without angina pectoris: Secondary | ICD-10-CM

## 2021-03-08 DIAGNOSIS — E7800 Pure hypercholesterolemia, unspecified: Secondary | ICD-10-CM | POA: Diagnosis not present

## 2021-03-08 NOTE — Assessment & Plan Note (Signed)
He is on Crestor 20 mg.  LDL most recently was 75-77.  He is getting it checked again soon.  If it is still above 70, I would suggest we add Zetia 10 mg once a day.  Explained to him lower the better.

## 2021-03-08 NOTE — Assessment & Plan Note (Signed)
Mild nonflow limiting CAD on CT scan as described above.  Doing well.  Continue with aspirin, high intensity statin.  Doing very well.  No changes made.

## 2021-03-08 NOTE — Assessment & Plan Note (Signed)
Multiple family members had myocardial infarction, age 63, his brother his father.  Aggressive risk factor modification.

## 2021-03-08 NOTE — Patient Instructions (Signed)
Medication Instructions:  The current medical regimen is effective;  continue present plan and medications.  *If you need a refill on your cardiac medications before your next appointment, please call your pharmacy*  Follow-Up: At [facility], you and your health needs are our priority.  As part of our continuing mission to provide you with exceptional heart care, we have created designated Provider Care Teams.  These Care Teams include your primary Cardiologist (physician) and Advanced Practice Providers (APPs -  Physician Assistants and Nurse Practitioners) who all work together to provide you with the care you need, when you need it.  We recommend signing up for the patient portal called "MyChart".  Sign up information is provided on this After Visit Summary.  MyChart is used to connect with patients for Virtual Visits (Telemedicine).  Patients are able to view lab/test results, encounter notes, upcoming appointments, etc.  Non-urgent messages can be sent to your provider as well.   To learn more about what you can do with MyChart, go to [email or web address].    Your next appointment:   1 year(s)  The format for your next appointment:   In Person  Provider:   Mark Skains, MD   Thank you for choosing [facility]!!    

## 2021-03-18 MED ORDER — ROSUVASTATIN CALCIUM 20 MG PO TABS: 20.0000 mg | ORAL_TABLET | Freq: Every day | ORAL | 3 refills | Status: DC

## 2021-03-18 NOTE — Addendum Note (Signed)
Addended by: Burnetta Sabin on: 03/18/2021 09:43 AM   Modules accepted: Orders

## 2021-06-25 ENCOUNTER — Encounter: Payer: Self-pay | Admitting: Cardiology

## 2021-06-25 MED ORDER — ROSUVASTATIN CALCIUM 20 MG PO TABS: 20.0000 mg | ORAL_TABLET | Freq: Every day | ORAL | 3 refills | Status: DC

## 2021-09-27 DIAGNOSIS — Z0000 Encounter for general adult medical examination without abnormal findings: Secondary | ICD-10-CM | POA: Diagnosis not present

## 2021-09-27 DIAGNOSIS — E7800 Pure hypercholesterolemia, unspecified: Secondary | ICD-10-CM | POA: Diagnosis not present

## 2021-09-27 DIAGNOSIS — Z125 Encounter for screening for malignant neoplasm of prostate: Secondary | ICD-10-CM | POA: Diagnosis not present

## 2021-10-13 ENCOUNTER — Emergency Department (HOSPITAL_COMMUNITY): Payer: BC Managed Care – PPO

## 2021-10-13 ENCOUNTER — Emergency Department (HOSPITAL_COMMUNITY)
Admission: EM | Admit: 2021-10-13 | Discharge: 2021-10-13 | Disposition: A | Discharge status: Home / Self Care | Payer: BC Managed Care – PPO | Attending: Emergency Medicine | Admitting: Emergency Medicine

## 2021-10-13 DIAGNOSIS — S61012A Laceration without foreign body of left thumb without damage to nail, initial encounter: Secondary | ICD-10-CM | POA: Insufficient documentation

## 2021-10-13 DIAGNOSIS — Z7982 Long term (current) use of aspirin: Secondary | ICD-10-CM | POA: Insufficient documentation

## 2021-10-13 DIAGNOSIS — M19042 Primary osteoarthritis, left hand: Secondary | ICD-10-CM | POA: Diagnosis not present

## 2021-10-13 DIAGNOSIS — W312XXA Contact with powered woodworking and forming machines, initial encounter: Secondary | ICD-10-CM | POA: Diagnosis not present

## 2021-10-13 MED ORDER — BACITRACIN ZINC 500 UNIT/GM EX OINT
TOPICAL_OINTMENT | Freq: Two times a day (BID) | CUTANEOUS | Status: DC
  Administered 2021-10-13: 1 via TOPICAL
  Filled 2021-10-13: qty 0.9

## 2021-10-13 MED ORDER — DOXYCYCLINE HYCLATE 100 MG PO CAPS: 100.0000 mg | ORAL_CAPSULE | Freq: Two times a day (BID) | ORAL | 0 refills | Status: DC

## 2021-10-13 MED ORDER — LIDOCAINE HCL (PF) 1 % IJ SOLN
10.0000 mL | Freq: Once | INTRAMUSCULAR | Status: AC
  Administered 2021-10-13: 10 mL
  Filled 2021-10-13: qty 30

## 2021-10-13 NOTE — ED Provider Notes (Signed)
[facility]-EMERGENCY DEPT Provider Note   CSN: [id number] Arrival date & time: 10/13/21  1154     History Chief Complaint  Patient presents with   Finger Injury    Oscar Woodard is a 66 y.o. male with h/o hyperlipidemia presents to the ED for evaluation of left thumb injury/laceration. The patient reports that he was drilling a piece of wood when it broke off and the wood cut his left thumb. He denies any numbness or tingling and reports that he can move his thumb well. He reports his tetanus is up to date. No medications prior to arrival .   HPI     Home Medications Prior to Admission medications   Medication Sig Start Date End Date Taking? Authorizing Provider  aspirin EC 81 MG tablet Take 81 mg by mouth daily.    [provider]  Azelaic Acid (FINACEA) 15 % FOAM Apply 1 application topically daily. 03/19/20   Janalyn Harder, MD  rosuvastatin (CRESTOR) 20 MG tablet Take 1 tablet (20 mg total) by mouth daily. 06/25/21   Jake Bathe, MD      Allergies    Cefaclor, Penicillins, and Amoxicillin    Review of Systems   Review of Systems  Skin:  Positive for wound.  Neurological:  Negative for numbness.   Physical Exam Updated Vital Signs BP (!) 144/80 (BP Location: Right Arm)   Pulse (!) 56   Temp 98.7 F (37.1 C) (Oral)   Resp 18   Ht 5\' 9"  (1.753 m)   Wt 87.5 kg   SpO2 100%   BMI 28.50 kg/m  Physical Exam Constitutional:      General: He is not in acute distress.    Appearance: Normal appearance. He is not ill-appearing or toxic-appearing.  Eyes:     General: No scleral icterus. Pulmonary:     Effort: Pulmonary effort is normal. No respiratory distress.  Musculoskeletal:     Comments: Right thumb - sensation intact. Cap refill <2 seconds. The patient does have blue/purple skin noted to the finger pads on bilateral hands as he was working with food dye dying the wood. He has an approximately 2cm laceration noted to the ulnar aspect of  the left thumb curving to the pad. It appears that the patient's cuticle was involved mildly <37mm and was superficial. Does not appear to have any nail involvement. He has full ROM of his thumb and wrist.   Skin:    General: Skin is dry.     Findings: No rash.  Neurological:     General: No focal deficit present.     Mental Status: He is alert. Mental status is at baseline.  Psychiatric:        Mood and Affect: Mood normal.        ED Results / Procedures / Treatments   Labs (all labs ordered are listed, but only abnormal results are displayed) Labs Reviewed - No data to display  EKG None  Radiology DG Finger Thumb Left  Result Date: 10/13/2021 CLINICAL DATA:  Laceration near thumb nail. Possible foreign body. Injured left thumb using a drill press. EXAM: LEFT THUMB 2+V COMPARISON:  None Available. FINDINGS: Mild thumb interphalangeal joint space narrowing and peripheral dorsal and lateral degenerative spurring. Mild soft tissue injury of the distal aspect of the thumb. Tiny chronic appearing ossicle at the dorsal aspect of the thumb metacarpophalangeal joint. Moderate joint space narrowing, subchondral cystic change, and peripheral osteophytosis of the thumb carpometacarpal joint. Moderate  triscaphe joint space narrowing. No acute fracture or dislocation. IMPRESSION: 1. No acute fracture is seen. 2. Moderate thumb carpometacarpal and mild thumb metacarpophalangeal and interphalangeal osteoarthritis. Electronically Signed   By: Neita Garnet M.D.   On: 10/13/2021 13:03    Procedures .Marland KitchenLaceration Repair  Date/Time: 10/13/2021 2:31 PM Performed by: Achille Rich, PA-C Authorized by: Achille Rich, PA-C   Consent:    Consent obtained:  Verbal   Consent given by:  Patient   Risks discussed:  Infection, need for additional repair, pain, poor cosmetic result, poor wound healing, retained foreign body and nerve damage   Alternatives discussed:  No treatment and delayed  treatment Universal protocol:    Procedure explained and questions answered to patient or proxy's satisfaction: yes     Patient identity confirmed:  Verbally with patient Anesthesia:    Anesthesia method:  Nerve block   Block needle gauge:  25 G   Block anesthetic:  Lidocaine 1% w/o epi   Block injection procedure:  Anatomic landmarks identified, anatomic landmarks palpated and negative aspiration for blood   Block outcome:  Anesthesia achieved Laceration details:    Location:  Finger   Finger location:  L thumb   Length (cm):  2 Pre-procedure details:    Preparation:  Patient was prepped and draped in usual sterile fashion and imaging obtained to evaluate for foreign bodies Exploration:    Hemostasis achieved with:  Tourniquet   Imaging obtained: x-ray     Imaging outcome: foreign body not noted     Wound exploration: wound explored through full range of motion and entire depth of wound visualized     Wound extent: no foreign bodies/material noted, no tendon damage noted and no underlying fracture noted   Treatment:    Area cleansed with:  Saline and Shur-Clens   Amount of cleaning:  Extensive   Irrigation solution:  Sterile saline   Irrigation volume:  50   Irrigation method:  Syringe and tap Skin repair:    Repair method:  Sutures   Suture size:  4-0   Suture material:  Nylon   Suture technique:  Simple interrupted   Number of sutures:  2 Approximation:    Approximation:  Close Repair type:    Repair type:  Simple Post-procedure details:    Dressing:  Antibiotic ointment and non-adherent dressing   Procedure completion:  Tolerated well, no immediate complications   Medications Ordered in ED Medications  lidocaine (PF) (XYLOCAINE) 1 % injection 10 mL (has no administration in time range)    ED Course/ Medical Decision Making/ A&P                           Medical Decision Making Amount and/or Complexity of Data Reviewed Radiology: ordered.  Risk OTC  drugs. Prescription drug management.   66 year old male presents the emergency department for evaluation of left thumb laceration.  Differential diagnosis includes was but is not limited to tendon or ligament damage, retained foreign body, laceration.  Vital signs show mildly elevated blood pressure 144/80, afebrile, slightly bradycardic at 56 with a normal respiratory rate and satting well on room air.  Physical exam is pertinent for Right thumb - sensation intact. Cap refill <2 seconds. The patient does have blue/purple skin noted to the finger pads on bilateral hands as he was working with food dye dying the wood. He has an approximately 2cm laceration noted to the ulnar aspect of the left thumb curving to  the pad. It appears that the patient's cuticle was involved mildly <482mm and was superficial. Does not appear to have any nail involvement. He has full ROM of his thumb and wrist.   I independently reviewed and interpreted the patient's labs and imaging and agree with radiologist interpretation.  Left thumb x-ray shows1. No acute fracture is seen. 2. Moderate thumb carpometacarpal and mild thumb metacarpophalangeal and interphalangeal osteoarthritis. I do not visualize any radio-opaque foreign bodies.   Patient reports his tetanus is up-to-date.  Please see procedure note for more information. 2 sutures placed. Patient tolerated the procedure well.  Had a shared decision making with the patient and family member at bedside about repairing the patient's cuticle bed.  The laceration does seem very superficial although I did talk to the patient that we could remove his nail to make sure that the side is further locked and although this may heal without.  Discussed that we could leave it alone and just suture the side.  Patient would like to proceed with just suturing the side of the wound and he is okay without removing the nail.  I think this is reasonable.  Will place patient on antibiotic for  infection prophylaxis.   Patient will be on doxycycline twice a day for the next 7 days for prophylaxis.  We discussed that he may also need to return to his PCP, urgent care, or the emergency department in 7 days for suture removal.  We discussed suture care.  We discussed strict return precautions and red flag symptoms.  The patient verbalized understanding and agrees to plan.  Patient is stable being discharged home in good condition.   Final Clinical Impression(s) / ED Diagnoses Final diagnoses:  Laceration of left thumb without foreign body without damage to nail, initial encounter    Rx / DC Orders ED Discharge Orders          Ordered    doxycycline (VIBRAMYCIN) 100 MG capsule  2 times daily        10/13/21 1443              Achille RichRansom, Rayshaun Needle, PA-C 10/13/21 1501    Derwood KaplanNanavati, Ankit, MD 10/17/21 1105

## 2021-10-13 NOTE — Discharge Instructions (Addendum)
You were seen in the ER for evaluation of your left thumb laceration. There was no fracture seen on the XR. There were no foreign bodies seen on Xray and I did not see any on your exam, although there is still a risk. I would keep the wound covered with a bandage for the next 24-48 hours, afterwards you can leave open to air. You will need to clean it at least once daily with Dial soap and water. Make sure it is dry before applying bandage.  Please do not submerge your hand in any dirty water such as dishwater, bathtub, Jacuzzi, pools, lakes, rivers, Catering manager. You can return to your PCP, urgent care, or the emergency department in 7 days to have the sutures removed.  You can take Tylenol as needed for pain.  I have placed you on antibiotic called doxycycline to take twice daily for the next 7 days.  If you have any concern, new or worsening symptoms, please return to the nearest emergency department for reevaluation.  Contact a doctor if: You got a tetanus shot and you have any of these problems where the needle went in: Swelling. Very bad pain. Redness. Bleeding. A wound that was closed breaks open. You have a fever. You have any of these signs of infection in your wound: More redness, swelling, or pain. Fluid or blood. Warmth. Pus or a bad smell. You see something coming out of the wound, such as wood or glass. Medicine does not make your pain go away. You notice a change in the color of your skin near your wound. You need to change the bandage often. You have a new rash. You lose feeling (have numbness) around the wound. Get help right away if: You have very bad swelling around the wound. Your pain suddenly gets worse and is very bad. You have painful lumps near the wound or on skin anywhere on your body. You have a red streak going away from your wound. The wound is on your hand or foot, and: You cannot move a finger or toe. Your fingers or toes look pale or bluish.

## 2021-10-13 NOTE — ED Triage Notes (Signed)
Pt reports injuring L thumb using a drill press. States that a piece of wood was accidentally slung and lacerated his finger near the nail. UTD on tetanus. Bleeding controlled with pressure.

## 2021-10-23 DIAGNOSIS — S61032D Puncture wound without foreign body of left thumb without damage to nail, subsequent encounter: Secondary | ICD-10-CM | POA: Diagnosis not present

## 2021-11-06 DIAGNOSIS — M25521 Pain in right elbow: Secondary | ICD-10-CM | POA: Diagnosis not present

## 2021-11-06 DIAGNOSIS — M7711 Lateral epicondylitis, right elbow: Secondary | ICD-10-CM | POA: Diagnosis not present

## 2022-01-16 DIAGNOSIS — L570 Actinic keratosis: Secondary | ICD-10-CM | POA: Diagnosis not present

## 2022-01-16 DIAGNOSIS — D1801 Hemangioma of skin and subcutaneous tissue: Secondary | ICD-10-CM | POA: Diagnosis not present

## 2022-01-16 DIAGNOSIS — L814 Other melanin hyperpigmentation: Secondary | ICD-10-CM | POA: Diagnosis not present

## 2022-01-16 DIAGNOSIS — L821 Other seborrheic keratosis: Secondary | ICD-10-CM | POA: Diagnosis not present

## 2022-01-16 DIAGNOSIS — L578 Other skin changes due to chronic exposure to nonionizing radiation: Secondary | ICD-10-CM | POA: Diagnosis not present

## 2022-01-29 DIAGNOSIS — Z23 Encounter for immunization: Secondary | ICD-10-CM | POA: Diagnosis not present

## 2022-05-27 ENCOUNTER — Encounter (HOSPITAL_BASED_OUTPATIENT_CLINIC_OR_DEPARTMENT_OTHER): Payer: Self-pay | Admitting: Cardiology

## 2022-05-27 NOTE — Telephone Encounter (Signed)
Routing to correct pool, please assist patient to get scheduled

## 2022-05-29 ENCOUNTER — Telehealth: Payer: BC Managed Care – PPO | Admitting: Cardiology

## 2022-05-29 DIAGNOSIS — I2510 Atherosclerotic heart disease of native coronary artery without angina pectoris: Secondary | ICD-10-CM

## 2022-05-29 NOTE — Telephone Encounter (Signed)
Patient is requesting an order be placed for a coronary calcium scan.

## 2022-05-29 NOTE — Telephone Encounter (Signed)
Pt is requesting another Coronary CA score.  Last one was completed in 2020 - IMPRESSION: 1. Coronary calcium score of 89. This was 0 percentile for age and sex matched control.  Pt is scheduled for upcoming appt with Dr Marlou Porch 08/25/2022.  Will have him to review for any orders.

## 2022-05-30 NOTE — Telephone Encounter (Signed)
OK to order coronary calcium score [name], MD    Called to advise pt.  Left message on personal voicemail that he will be contacted to be scheduled.  Requested he call back if any questions or concerns.

## 2022-06-11 ENCOUNTER — Encounter (HOSPITAL_BASED_OUTPATIENT_CLINIC_OR_DEPARTMENT_OTHER): Payer: Self-pay

## 2022-06-11 ENCOUNTER — Ambulatory Visit (HOSPITAL_BASED_OUTPATIENT_CLINIC_OR_DEPARTMENT_OTHER)
Admission: RE | Admit: 2022-06-11 | Discharge: 2022-06-11 | Disposition: A | Discharge status: Home / Self Care | Payer: BC Managed Care – PPO | Source: Ambulatory Visit | Attending: Cardiology | Admitting: Cardiology

## 2022-06-11 DIAGNOSIS — I2510 Atherosclerotic heart disease of native coronary artery without angina pectoris: Secondary | ICD-10-CM | POA: Insufficient documentation

## 2022-06-20 ENCOUNTER — Telehealth: Payer: Self-pay | Admitting: Cardiology

## 2022-06-20 DIAGNOSIS — E7800 Pure hypercholesterolemia, unspecified: Secondary | ICD-10-CM

## 2022-06-20 DIAGNOSIS — Z79899 Other long term (current) drug therapy: Secondary | ICD-10-CM

## 2022-06-20 NOTE — Telephone Encounter (Signed)
Pt returning call for ct results  

## 2022-06-20 NOTE — Telephone Encounter (Signed)
Coronary calcium score is 149, 64th percentile. Medications listed are Crestor 20 mg once a day. Lets check a lipid panel to make sure that she is at goal LDL less than 70. [name], MD   Pt is aware and scheduled for lab 06/25/2022.

## 2022-06-25 ENCOUNTER — Ambulatory Visit: Payer: BC Managed Care – PPO | Attending: Cardiology

## 2022-06-25 DIAGNOSIS — E7800 Pure hypercholesterolemia, unspecified: Secondary | ICD-10-CM

## 2022-06-25 DIAGNOSIS — Z79899 Other long term (current) drug therapy: Secondary | ICD-10-CM | POA: Diagnosis not present

## 2022-06-25 LAB — LIPID PANEL
Chol/HDL Ratio: 2.6 ratio (ref 0.0–5.0)
Cholesterol, Total: 148 mg/dL (ref 100–199)
HDL: 57 mg/dL (ref >39)
LDL Chol Calc (NIH): 76 mg/dL (ref 0–99)
Triglycerides: 80 mg/dL (ref 0–149)
VLDL Cholesterol Cal: 15 mg/dL (ref 5–40)

## 2022-06-27 ENCOUNTER — Telehealth: Payer: Self-pay

## 2022-06-27 DIAGNOSIS — E7800 Pure hypercholesterolemia, unspecified: Secondary | ICD-10-CM

## 2022-06-27 MED ORDER — EZETIMIBE 10 MG PO TABS: 10.0000 mg | ORAL_TABLET | Freq: Every day | ORAL | 3 refills | Status: DC

## 2022-06-27 MED ORDER — ROSUVASTATIN CALCIUM 20 MG PO TABS: 20.0000 mg | ORAL_TABLET | Freq: Every day | ORAL | 3 refills | Status: DC

## 2022-06-27 NOTE — Telephone Encounter (Signed)
Left message for return call for results.

## 2022-07-19 ENCOUNTER — Encounter: Payer: Self-pay | Admitting: Cardiology

## 2022-07-19 DIAGNOSIS — E7800 Pure hypercholesterolemia, unspecified: Secondary | ICD-10-CM

## 2022-07-21 MED ORDER — EZETIMIBE 10 MG PO TABS: 10.0000 mg | ORAL_TABLET | Freq: Every day | ORAL | 1 refills | Status: DC

## 2022-07-21 MED ORDER — ROSUVASTATIN CALCIUM 20 MG PO TABS: 20.0000 mg | ORAL_TABLET | Freq: Every day | ORAL | 1 refills | Status: DC

## 2022-08-01 ENCOUNTER — Other Ambulatory Visit: Payer: Self-pay

## 2022-08-01 DIAGNOSIS — E7800 Pure hypercholesterolemia, unspecified: Secondary | ICD-10-CM

## 2022-08-01 MED ORDER — EZETIMIBE 10 MG PO TABS: 10.0000 mg | ORAL_TABLET | Freq: Every day | ORAL | 0 refills | Status: DC

## 2022-08-25 ENCOUNTER — Ambulatory Visit: Payer: BC Managed Care – PPO | Attending: Cardiology | Admitting: Cardiology

## 2022-08-25 ENCOUNTER — Encounter: Payer: Self-pay | Admitting: Cardiology

## 2022-08-25 VITALS — BP 126/80 | HR 53 | Ht 69.0 in | Wt 206.8 lb

## 2022-08-25 DIAGNOSIS — I2510 Atherosclerotic heart disease of native coronary artery without angina pectoris: Secondary | ICD-10-CM

## 2022-08-25 DIAGNOSIS — E7800 Pure hypercholesterolemia, unspecified: Secondary | ICD-10-CM

## 2022-08-25 DIAGNOSIS — Z8249 Family history of ischemic heart disease and other diseases of the circulatory system: Secondary | ICD-10-CM | POA: Diagnosis not present

## 2022-08-25 DIAGNOSIS — Z79899 Other long term (current) drug therapy: Secondary | ICD-10-CM | POA: Diagnosis not present

## 2022-08-25 MED ORDER — ROSUVASTATIN CALCIUM 20 MG PO TABS
20.0000 mg | ORAL_TABLET | Freq: Every day | ORAL | 3 refills | Status: DC
Start: 2022-08-25 — End: 2023-10-02

## 2022-08-25 NOTE — Progress Notes (Signed)
Cardiology Office Note:    Date:  08/25/2022   ID:  Oscar Woodard, DOB 1955/06/07, MRN [id number]  PCP:  Darrow Bussing, MD  [facility] Cardiologist:  Donato Schultz, MD  [facility] Electrophysiologist:  None   Referring MD: Darrow Bussing, MD    History of Present Illness:    Oscar Woodard is a 67 y.o. male here for the follow-up of CAD, Coronary calcium score is 149, 64th percentile. Lipids, hypertension.   Early family history of coronary artery disease, brother MI 75 father MI 42.  Crestor changed from torvastatin for hyperlipidemia. Zetia added 2.12.24. Cramping in legs, Zetia (3 weeks).  We went ahead and discontinued on 08/25/2022 to see how he does.  He can certainly trial again.  See below for  Prior evaluation of chest heaviness.  Coronary CT scan 01/07/2019: 1. Coronary calcium score of 89. This was 0 percentile for age and sex matched control.   2. Normal coronary origin with right dominance.   3. CAD-RADS 1. Minimal non-obstructive CAD (0-24%) in the proximal LAD. Consider non-atherosclerotic causes of chest pain. Consider preventive therapy and risk factor modification.   Coronary calcium score 06/11/22: Coronary calcium score of 149. This was 64th percentile for age-, race-, and sex-matched controls.  At his last appointment he was overall feeling well. Oscar Woodard, a patient of mine is his wife's mother.  Occasionally when he is over there he will check his blood pressure.  It was just a little bit elevated here in the clinic. 140/90.  Previously worked at [employer].  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  He is going to need to get on the exchange for health insurance.  Prices for this range from $1800 a month to $6300 a month for the gold plan.  No fevers chills nausea vomiting syncope bleeding.  Past Medical History:  Diagnosis Date   Headache    High cholesterol     Past Surgical History:  Procedure Laterality Date   NO PAST SURGERIES       Current Medications: Current Meds  Medication Sig   aspirin EC 81 MG tablet Take 81 mg by mouth daily.   [DISCONTINUED] ezetimibe (ZETIA) 10 MG tablet Take 1 tablet (10 mg total) by mouth daily.   [DISCONTINUED] rosuvastatin (CRESTOR) 20 MG tablet Take 1 tablet (20 mg total) by mouth daily.     Allergies:   Cefaclor, Penicillins, and Amoxicillin   Social History   Socioeconomic History   Marital status: Married    Spouse name: Not on file   Number of children: Not on file   Years of education: Not on file   Highest education level: Not on file  Occupational History   Not on file  Tobacco Use   Smoking status: Never   Smokeless tobacco: Never  Substance and Sexual Activity   Alcohol use: Yes   Drug use: Not on file   Sexual activity: Not on file  Other Topics Concern   Not on file  Social History Narrative   Not on file   Social Determinants of Health   Financial Resource Strain: Not on file  Food Insecurity: Not on file  Transportation Needs: Not on file  Physical Activity: Not on file  Stress: Not on file  Social Connections: Not on file     Family History: The patient's family history includes Heart attack (age of onset: 44) in his brother and father.  ROS:   Please see the history of present  illness.    All other systems reviewed and are negative.  EKGs/Labs/Other Studies Reviewed:    The following studies were reviewed today: CT scan's once again reviewed as above.    EKG:   08/25/22: Sinus bradycardia 53 with poor R wave progression 03/08/2021: Sinus bradycardia. Rate 51 bpm. 01/18/2020: sinus bradycardia 46 no other abnormalities  Recent Labs: No results found for requested labs within last 365 days.  Recent Lipid Panel    Component Value Date/Time   CHOL 148 06/25/2022 0757   TRIG 80 06/25/2022 0757   HDL 57 06/25/2022 0757   CHOLHDL 2.6 06/25/2022 0757   LDLCALC 76 06/25/2022 0757   04/11/2020: LDL 77  06/25/22: LDL 76  Physical Exam:     VS:  BP 126/80   Pulse (!) 53   Ht 5\' 9"  (1.753 m)   Wt 206 lb 12.8 oz (93.8 kg)   SpO2 98%   BMI 30.54 kg/m     Wt Readings from Last 3 Encounters:  08/25/22 206 lb 12.8 oz (93.8 kg)  10/13/21 193 lb (87.5 kg)  03/08/21 199 lb (90.3 kg)     GEN:  Well nourished, well developed in no acute distress HEENT: Normal NECK: No JVD; No carotid bruits LYMPHATICS: No lymphadenopathy CARDIAC: RRR, no murmurs, rubs, gallops RESPIRATORY:  Clear to auscultation without rales, wheezing or rhonchi  ABDOMEN: Soft, non-tender, non-distended MUSCULOSKELETAL:  No edema; No deformity  SKIN: Warm and dry NEUROLOGIC:  Alert and oriented x 3 PSYCHIATRIC:  Normal affect   ASSESSMENT:    1. Coronary artery disease involving native coronary artery of native heart without angina pectoris   2. Pure hypercholesterolemia   3. Family history of early CAD   4. Medication management      PLAN:    In order of problems listed above:  CAD (coronary artery disease) Coronary calcium score is 149, 64th percentile.  Mild nonflow limiting CAD on CT scan as described above.  Doing well.  Continue with aspirin, high intensity statin.  Doing very well.  No changes made.  Family history of early CAD Multiple family members had myocardial infarction, age 10, his brother his father.  Aggressive risk factor modification.  Pure hypercholesterolemia He is on Crestor 20 mg.  LDL most recently was 75-77.  We tried adding Zetia 10 mg once a day.  He did state that he was having some leg cramps with this addition.  Explained the rationale of plaque stabilization.  He can go ahead and come off of the Zetia now and see how he feels.  If he would like to try a few pills in the future he certainly can.  He will let us know.  We do have alternatives such as PCSK9 although does not seem extremely enthusiastic about these.  Continue with Mediterranean diet exercise.  Try to limit cheese and bread consumption.  Follow-up:    12 months.  Medication Adjustments/Labs and Tests Ordered: Current medicines are reviewed at length with the patient today.  Concerns regarding medicines are outlined above.  Orders Placed This Encounter  Procedures   EKG 12-Lead    Meds ordered this encounter  Medications   rosuvastatin (CRESTOR) 20 MG tablet    Sig: Take 1 tablet (20 mg total) by mouth daily.    Dispense:  90 tablet    Refill:  3     Patient Instructions  Medication Instructions:  STOP Zetia (ezetimibe)   *If you need a refill on your cardiac medications before  your next appointment, please call your pharmacy*   Follow-Up: At [facility], you and your health needs are our priority.  As part of our continuing mission to provide you with exceptional heart care, we have created designated Provider Care Teams.  These Care Teams include your primary Cardiologist (physician) and Advanced Practice Providers (APPs -  Physician Assistants and Nurse Practitioners) who all work together to provide you with the care you need, when you need it.  We recommend signing up for the patient portal called "MyChart".  Sign up information is provided on this After Visit Summary.  MyChart is used to connect with patients for Virtual Visits (Telemedicine).  Patients are able to view lab/test results, encounter notes, upcoming appointments, etc.  Non-urgent messages can be sent to your provider as well.   To learn more about what you can do with MyChart, go to [email or web address].    Your next appointment:   1 year(s)  Provider:   Donato SchultzMark Leomia Blake, MD     [facility],Mathew Stumpf,acting as a scribe for Donato SchultzMark Lalanya Rufener, MD.,have documented all relevant documentation on the behalf of Donato SchultzMark Rechy Bost, MD,as directed by  Donato SchultzMark Peter Daquila, MD while in the presence of Donato SchultzMark Alexzandria Massman, MD.  I, Donato SchultzMark Janann Boeve, MD, have reviewed all documentation for this visit. The documentation on 08/25/22 for the exam, diagnosis, procedures, and orders are all accurate and  complete.   Signed, Donato SchultzMark Eleftherios Dudenhoeffer, MD  08/25/2022 8:29 AM     [facility]

## 2022-08-25 NOTE — Patient Instructions (Signed)
Medication Instructions:  STOP Zetia (ezetimibe)   *If you need a refill on your cardiac medications before your next appointment, please call your pharmacy*   Follow-Up: At [facility], you and your health needs are our priority.  As part of our continuing mission to provide you with exceptional heart care, we have created designated Provider Care Teams.  These Care Teams include your primary Cardiologist (physician) and Advanced Practice Providers (APPs -  Physician Assistants and Nurse Practitioners) who all work together to provide you with the care you need, when you need it.  We recommend signing up for the patient portal called "MyChart".  Sign up information is provided on this After Visit Summary.  MyChart is used to connect with patients for Virtual Visits (Telemedicine).  Patients are able to view lab/test results, encounter notes, upcoming appointments, etc.  Non-urgent messages can be sent to your provider as well.   To learn more about what you can do with MyChart, go to [email or web address].    Your next appointment:   1 year(s)  Provider:   Donato Schultz, MD

## 2023-01-27 DIAGNOSIS — L578 Other skin changes due to chronic exposure to nonionizing radiation: Secondary | ICD-10-CM | POA: Diagnosis not present

## 2023-01-27 DIAGNOSIS — L814 Other melanin hyperpigmentation: Secondary | ICD-10-CM | POA: Diagnosis not present

## 2023-01-27 DIAGNOSIS — L570 Actinic keratosis: Secondary | ICD-10-CM | POA: Diagnosis not present

## 2023-01-27 DIAGNOSIS — L821 Other seborrheic keratosis: Secondary | ICD-10-CM | POA: Diagnosis not present

## 2023-01-28 DIAGNOSIS — R7303 Prediabetes: Secondary | ICD-10-CM | POA: Diagnosis not present

## 2023-01-28 DIAGNOSIS — E7800 Pure hypercholesterolemia, unspecified: Secondary | ICD-10-CM | POA: Diagnosis not present

## 2023-01-28 DIAGNOSIS — Z125 Encounter for screening for malignant neoplasm of prostate: Secondary | ICD-10-CM | POA: Diagnosis not present

## 2023-01-28 DIAGNOSIS — Z23 Encounter for immunization: Secondary | ICD-10-CM | POA: Diagnosis not present

## 2023-01-28 DIAGNOSIS — Z0000 Encounter for general adult medical examination without abnormal findings: Secondary | ICD-10-CM | POA: Diagnosis not present

## 2023-03-04 DIAGNOSIS — H524 Presbyopia: Secondary | ICD-10-CM | POA: Diagnosis not present

## 2023-05-14 DIAGNOSIS — Z0100 Encounter for examination of eyes and vision without abnormal findings: Secondary | ICD-10-CM | POA: Diagnosis not present

## 2023-06-16 DIAGNOSIS — M5416 Radiculopathy, lumbar region: Secondary | ICD-10-CM | POA: Diagnosis not present

## 2023-06-17 DIAGNOSIS — M5416 Radiculopathy, lumbar region: Secondary | ICD-10-CM | POA: Diagnosis not present

## 2023-07-01 DIAGNOSIS — M5416 Radiculopathy, lumbar region: Secondary | ICD-10-CM | POA: Diagnosis not present

## 2023-07-08 DIAGNOSIS — M5416 Radiculopathy, lumbar region: Secondary | ICD-10-CM | POA: Diagnosis not present

## 2023-08-07 DIAGNOSIS — M5416 Radiculopathy, lumbar region: Secondary | ICD-10-CM | POA: Diagnosis not present

## 2023-08-07 DIAGNOSIS — Z0184 Encounter for antibody response examination: Secondary | ICD-10-CM | POA: Diagnosis not present

## 2023-10-02 ENCOUNTER — Encounter: Payer: Self-pay | Admitting: Cardiology

## 2023-10-02 ENCOUNTER — Ambulatory Visit: Payer: BC Managed Care – PPO | Attending: Cardiology | Admitting: Cardiology

## 2023-10-02 VITALS — BP 120/68 | HR 54 | Ht 69.0 in | Wt 204.2 lb

## 2023-10-02 DIAGNOSIS — I2510 Atherosclerotic heart disease of native coronary artery without angina pectoris: Secondary | ICD-10-CM

## 2023-10-02 DIAGNOSIS — Z8249 Family history of ischemic heart disease and other diseases of the circulatory system: Secondary | ICD-10-CM

## 2023-10-02 DIAGNOSIS — Z79899 Other long term (current) drug therapy: Secondary | ICD-10-CM | POA: Diagnosis not present

## 2023-10-02 DIAGNOSIS — E7800 Pure hypercholesterolemia, unspecified: Secondary | ICD-10-CM

## 2023-10-02 MED ORDER — ROSUVASTATIN CALCIUM 20 MG PO TABS
20.0000 mg | ORAL_TABLET | Freq: Every day | ORAL | 3 refills | Status: AC
Start: 2023-10-02 — End: ?

## 2023-10-02 NOTE — Patient Instructions (Signed)

## 2023-10-02 NOTE — Progress Notes (Signed)
 Cardiology Office Note:  .   Date:  10/02/2023  ID:  Oscar Woodard, DOB 09/12/1955, MRN [id number] PCP: Lanae Pinal, MD  [facility] Providers Cardiologist:  Dorothye Gathers, MD    History of Present Illness: .   Oscar Woodard is a 68 y.o. male Discussed the use of AI scribe software for clinical note transcription with the patient, who gave verbal consent to proceed.  History of Present Illness Oscar Woodard is a 68 year old male with coronary artery disease who presents for follow-up.  He has a history of coronary artery disease with a coronary calcium  score of 149 in 2024. He is currently on rosuvastatin  20 mg, and his LDL cholesterol levels were 76 in February 2024 and 78 in September 2024. He previously experienced leg cramps with Zetia  10 mg, which resolved upon discontinuation. No chest pain, shortness of breath, or other unusual symptoms. He is on aspirin 81 mg daily.  He experiences back issues, including numbness from the back to the leg, which began after twisting while loading a car. He was previously treated with prednisone, which provided relief. He has been trying to get an MRI but has faced difficulties with insurance approval. He reports persistent numbness and occasional weakness in the leg, especially noted during a recent cruise.  He is retired, engaging in Scientist, product/process development, and enjoys visiting his grandkids. He recently returned from a [place] cruise, [place].      ROS: No CP  Studies Reviewed: Aaron Aas    EKG Interpretation Date/Time:  Friday Oct 02 2023 09:39:11 EDT Ventricular Rate:  54 PR Interval:  170 QRS Duration:  100 QT Interval:  436 QTC Calculation: 413 R Axis:   19  Text Interpretation: Sinus bradycardia No previous ECGs available Confirmed by Dorothye Gathers ([id number]) on 10/02/2023 10:23:13 AM    Results LABS LDL: 76 (06/2022) LDL: 78 (01/28/2023)  RADIOLOGY Coronary calcium  score: 149, 64th percentile (2024) CT scan: Minimal arterial  plaque (2020)  Risk Assessment/Calculations:            Physical Exam:   VS:  BP 120/68   Pulse (!) 54   Ht 5\' 9"  (1.753 m)   Wt 204 lb 3.2 oz (92.6 kg)   SpO2 96%   BMI 30.16 kg/m    Wt Readings from Last 3 Encounters:  10/02/23 204 lb 3.2 oz (92.6 kg)  08/25/22 206 lb 12.8 oz (93.8 kg)  10/13/21 193 lb (87.5 kg)    GEN: Well nourished, well developed in no acute distress NECK: No JVD; No carotid bruits CARDIAC: RRR, no murmurs, no rubs, no gallops RESPIRATORY:  Clear to auscultation without rales, wheezing or rhonchi  ABDOMEN: Soft, non-tender, non-distended EXTREMITIES:  No edema; No deformity   ASSESSMENT AND PLAN: .    Assessment and Plan Assessment & Plan Coronary artery disease Coronary artery disease with a coronary calcium  score of 149 in 2024, in the 64th percentile, slightly above expected for age. No current symptoms such as chest pain or dyspnea. Previous CT in 2020 showed minimal arterial plaque. Current management includes aspirin 81 mg daily and rosuvastatin  20 mg daily. LDL levels in 2024 were 76 and 78, slightly above the goal of less than 70. Zetia  was previously tried but caused myalgia, leading to discontinuation. He is not enthusiastic about PCSK9 inhibitors. Alternatives to Zetia , including oral and injectable forms, are available if needed in the future. - Continue aspirin 81 mg daily. - Continue rosuvastatin  20 mg daily. - Monitor LDL levels  with a goal of less than 70. - Discuss alternative medications to Zetia  if needed in the future. - No further testing required at this time due to lack of symptoms.  Pure hypercholesterolemia Hypercholesterolemia managed with rosuvastatin  20 mg daily. LDL levels are slightly above the target of less than 70, with recent measurements at 76 and 78. Zetia  was previously added but caused myalgia, leading to its discontinuation. He is not interested in PCSK9 inhibitors at this time. Alternatives to Zetia , including oral  and injectable forms, are available if needed in the future. - Continue rosuvastatin  20 mg daily. - Monitor LDL levels with a goal of less than 70. - Consider alternative medications to Zetia  if needed in the future.  Sciatica Chronic sciatica with recent exacerbation causing numbness and weakness in the left leg. Severe pain occurred after twisting while loading a car, relieved by prednisone. Currently experiencing persistent numbness and some weakness in the leg. Awaiting MRI for further evaluation, but facing delays with insurance approval. - Continue physical therapy and stretching exercises. - Await MRI for further evaluation of the back and leg symptoms. - Monitor for any changes in symptoms, particularly worsening numbness or weakness. Per ortho         Dispo: 1 yr  Signed, Dorothye Gathers, MD

## 2023-11-05 DIAGNOSIS — M5416 Radiculopathy, lumbar region: Secondary | ICD-10-CM | POA: Diagnosis not present

## 2023-11-09 DIAGNOSIS — M5416 Radiculopathy, lumbar region: Secondary | ICD-10-CM | POA: Diagnosis not present

## 2024-01-21 DIAGNOSIS — H04123 Dry eye syndrome of bilateral lacrimal glands: Secondary | ICD-10-CM | POA: Diagnosis not present

## 2024-01-21 DIAGNOSIS — H0288A Meibomian gland dysfunction right eye, upper and lower eyelids: Secondary | ICD-10-CM | POA: Diagnosis not present

## 2024-01-21 DIAGNOSIS — H43812 Vitreous degeneration, left eye: Secondary | ICD-10-CM | POA: Diagnosis not present

## 2024-01-21 DIAGNOSIS — H25012 Cortical age-related cataract, left eye: Secondary | ICD-10-CM | POA: Diagnosis not present

## 2024-02-02 DIAGNOSIS — H5203 Hypermetropia, bilateral: Secondary | ICD-10-CM | POA: Diagnosis not present

## 2024-02-02 DIAGNOSIS — H52223 Regular astigmatism, bilateral: Secondary | ICD-10-CM | POA: Diagnosis not present

## 2024-02-02 DIAGNOSIS — H524 Presbyopia: Secondary | ICD-10-CM | POA: Diagnosis not present

## 2024-02-04 DIAGNOSIS — L570 Actinic keratosis: Secondary | ICD-10-CM | POA: Diagnosis not present

## 2024-02-04 DIAGNOSIS — D1801 Hemangioma of skin and subcutaneous tissue: Secondary | ICD-10-CM | POA: Diagnosis not present

## 2024-02-04 DIAGNOSIS — L814 Other melanin hyperpigmentation: Secondary | ICD-10-CM | POA: Diagnosis not present

## 2024-02-04 DIAGNOSIS — L578 Other skin changes due to chronic exposure to nonionizing radiation: Secondary | ICD-10-CM | POA: Diagnosis not present

## 2024-02-04 DIAGNOSIS — L821 Other seborrheic keratosis: Secondary | ICD-10-CM | POA: Diagnosis not present

## 2024-02-04 DIAGNOSIS — L719 Rosacea, unspecified: Secondary | ICD-10-CM | POA: Diagnosis not present

## 2024-02-04 DIAGNOSIS — D229 Melanocytic nevi, unspecified: Secondary | ICD-10-CM | POA: Diagnosis not present

## 2024-02-23 DIAGNOSIS — H6123 Impacted cerumen, bilateral: Secondary | ICD-10-CM | POA: Diagnosis not present

## 2024-02-23 DIAGNOSIS — R0982 Postnasal drip: Secondary | ICD-10-CM | POA: Diagnosis not present

## 2024-02-23 DIAGNOSIS — R0981 Nasal congestion: Secondary | ICD-10-CM | POA: Diagnosis not present

## 2024-02-23 DIAGNOSIS — J3489 Other specified disorders of nose and nasal sinuses: Secondary | ICD-10-CM | POA: Diagnosis not present

## 2024-03-02 DIAGNOSIS — Z23 Encounter for immunization: Secondary | ICD-10-CM | POA: Diagnosis not present
